# Patient Record
Sex: Female | Born: 1950
Health system: Southern US, Community
[De-identification: ages and names within clinical notes are randomized; demographics above are authoritative.]

## PROBLEM LIST (undated history)

## (undated) DIAGNOSIS — Z8614 Personal history of Methicillin resistant Staphylococcus aureus infection: Secondary | ICD-10-CM

## (undated) DIAGNOSIS — Z8542 Personal history of malignant neoplasm of other parts of uterus: Secondary | ICD-10-CM

## (undated) DIAGNOSIS — C801 Malignant (primary) neoplasm, unspecified: Secondary | ICD-10-CM

## (undated) DIAGNOSIS — R569 Unspecified convulsions: Secondary | ICD-10-CM

## (undated) DIAGNOSIS — M549 Dorsalgia, unspecified: Secondary | ICD-10-CM

## (undated) DIAGNOSIS — J449 Chronic obstructive pulmonary disease, unspecified: Secondary | ICD-10-CM

## (undated) DIAGNOSIS — G8929 Other chronic pain: Secondary | ICD-10-CM

## (undated) DIAGNOSIS — I10 Essential (primary) hypertension: Secondary | ICD-10-CM

## (undated) DIAGNOSIS — I2510 Atherosclerotic heart disease of native coronary artery without angina pectoris: Secondary | ICD-10-CM

## (undated) DIAGNOSIS — M542 Cervicalgia: Secondary | ICD-10-CM

## (undated) HISTORY — DX: Essential (primary) hypertension: I10

## (undated) HISTORY — PX: PARTIAL HYSTERECTOMY: SHX80

## (undated) HISTORY — PX: TUMOR EXCISION: SHX421

## (undated) HISTORY — DX: Personal history of Methicillin resistant Staphylococcus aureus infection: Z86.14

## (undated) HISTORY — DX: Dorsalgia, unspecified: M54.9

## (undated) HISTORY — PX: BACK SURGERY: SHX140

## (undated) HISTORY — PX: THYROID SURGERY: SHX805

## (undated) HISTORY — PX: THYMECTOMY: SHX1063

## (undated) HISTORY — DX: Personal history of malignant neoplasm of other parts of uterus: Z85.42

## (undated) HISTORY — DX: Cervicalgia: M54.2

## (undated) HISTORY — PX: NECK SURGERY: SHX720

## (undated) HISTORY — DX: Unspecified convulsions: R56.9

## (undated) HISTORY — DX: Chronic obstructive pulmonary disease, unspecified: J44.9

## (undated) HISTORY — DX: Atherosclerotic heart disease of native coronary artery without angina pectoris: I25.10

## (undated) HISTORY — DX: Other chronic pain: G89.29

---

## 1998-12-30 HISTORY — PX: APPENDECTOMY: SHX54

## 1999-05-15 ENCOUNTER — Ambulatory Visit (HOSPITAL_COMMUNITY): Admission: RE | Admit: 1999-05-15 | Discharge: 1999-05-15 | Payer: Self-pay | Admitting: Cardiology

## 1999-12-31 HISTORY — PX: CHOLECYSTECTOMY: SHX55

## 2001-03-18 ENCOUNTER — Observation Stay (HOSPITAL_COMMUNITY): Admission: RE | Admit: 2001-03-18 | Discharge: 2001-03-19 | Payer: Self-pay | Admitting: Neurosurgery

## 2001-04-17 ENCOUNTER — Encounter: Admission: RE | Admit: 2001-04-17 | Discharge: 2001-04-17 | Payer: Self-pay | Admitting: Neurosurgery

## 2001-05-14 ENCOUNTER — Encounter (HOSPITAL_COMMUNITY): Admission: RE | Admit: 2001-05-14 | Discharge: 2001-06-13 | Payer: Self-pay | Admitting: Neurosurgery

## 2001-06-20 ENCOUNTER — Ambulatory Visit (HOSPITAL_COMMUNITY): Admission: RE | Admit: 2001-06-20 | Discharge: 2001-06-20 | Payer: Self-pay | Admitting: Neurosurgery

## 2001-07-29 ENCOUNTER — Ambulatory Visit (HOSPITAL_BASED_OUTPATIENT_CLINIC_OR_DEPARTMENT_OTHER): Admission: RE | Admit: 2001-07-29 | Discharge: 2001-07-29 | Payer: Self-pay | Admitting: Orthopedic Surgery

## 2001-08-11 ENCOUNTER — Encounter (HOSPITAL_COMMUNITY): Admission: RE | Admit: 2001-08-11 | Discharge: 2001-09-10 | Payer: Self-pay | Admitting: Orthopedic Surgery

## 2001-09-11 ENCOUNTER — Encounter: Admission: RE | Admit: 2001-09-11 | Discharge: 2001-09-11 | Payer: Self-pay | Admitting: Neurosurgery

## 2001-09-15 ENCOUNTER — Encounter (HOSPITAL_COMMUNITY): Admission: RE | Admit: 2001-09-15 | Discharge: 2001-10-15 | Payer: Self-pay | Admitting: Orthopedic Surgery

## 2001-10-15 ENCOUNTER — Ambulatory Visit (HOSPITAL_COMMUNITY): Admission: RE | Admit: 2001-10-15 | Discharge: 2001-10-15 | Payer: Self-pay | Admitting: Family Medicine

## 2001-10-15 ENCOUNTER — Encounter: Payer: Self-pay | Admitting: Family Medicine

## 2002-03-17 ENCOUNTER — Ambulatory Visit (HOSPITAL_COMMUNITY): Admission: RE | Admit: 2002-03-17 | Discharge: 2002-03-17 | Payer: Self-pay | Admitting: Neurosurgery

## 2006-01-22 ENCOUNTER — Inpatient Hospital Stay (HOSPITAL_COMMUNITY): Admission: RE | Admit: 2006-01-22 | Discharge: 2006-01-23 | Payer: Self-pay | Admitting: Neurosurgery

## 2006-02-13 ENCOUNTER — Ambulatory Visit (HOSPITAL_COMMUNITY): Admission: RE | Admit: 2006-02-13 | Discharge: 2006-02-13 | Payer: Self-pay | Admitting: Neurosurgery

## 2006-05-01 ENCOUNTER — Ambulatory Visit (HOSPITAL_COMMUNITY): Admission: RE | Admit: 2006-05-01 | Discharge: 2006-05-01 | Payer: Self-pay | Admitting: Pulmonary Disease

## 2006-10-13 ENCOUNTER — Encounter: Admission: RE | Admit: 2006-10-13 | Discharge: 2006-10-13 | Payer: Self-pay | Admitting: Neurosurgery

## 2007-04-16 ENCOUNTER — Ambulatory Visit (HOSPITAL_COMMUNITY): Admission: RE | Admit: 2007-04-16 | Discharge: 2007-04-17 | Payer: Self-pay | Admitting: Neurosurgery

## 2007-12-18 ENCOUNTER — Encounter: Admission: RE | Admit: 2007-12-18 | Discharge: 2007-12-18 | Payer: Self-pay | Admitting: Neurosurgery

## 2009-08-03 ENCOUNTER — Ambulatory Visit (HOSPITAL_COMMUNITY): Admission: RE | Admit: 2009-08-03 | Discharge: 2009-08-03 | Payer: Self-pay | Admitting: Neurology

## 2009-09-27 ENCOUNTER — Ambulatory Visit (HOSPITAL_COMMUNITY): Admission: RE | Admit: 2009-09-27 | Discharge: 2009-09-27 | Payer: Self-pay | Admitting: Advanced Practice Midwife

## 2009-10-03 ENCOUNTER — Ambulatory Visit (HOSPITAL_COMMUNITY): Admission: RE | Admit: 2009-10-03 | Discharge: 2009-10-03 | Payer: Self-pay | Admitting: Family Medicine

## 2009-10-05 ENCOUNTER — Encounter (INDEPENDENT_AMBULATORY_CARE_PROVIDER_SITE_OTHER): Payer: Self-pay | Admitting: *Deleted

## 2009-10-05 ENCOUNTER — Ambulatory Visit: Payer: Self-pay | Admitting: Cardiothoracic Surgery

## 2009-10-06 ENCOUNTER — Ambulatory Visit: Payer: Self-pay | Admitting: Internal Medicine

## 2009-10-06 DIAGNOSIS — E079 Disorder of thyroid, unspecified: Secondary | ICD-10-CM | POA: Insufficient documentation

## 2009-10-06 DIAGNOSIS — R945 Abnormal results of liver function studies: Secondary | ICD-10-CM | POA: Insufficient documentation

## 2009-10-06 DIAGNOSIS — R634 Abnormal weight loss: Secondary | ICD-10-CM

## 2009-10-06 DIAGNOSIS — K838 Other specified diseases of biliary tract: Secondary | ICD-10-CM

## 2009-10-06 DIAGNOSIS — K5909 Other constipation: Secondary | ICD-10-CM

## 2009-10-07 ENCOUNTER — Encounter: Payer: Self-pay | Admitting: Gastroenterology

## 2009-10-11 LAB — CONVERTED CEMR LAB
ALT: 286 units/L — ABNORMAL HIGH (ref 0–35)
Alkaline Phosphatase: 258 units/L — ABNORMAL HIGH (ref 39–117)
Bilirubin, Direct: 0.2 mg/dL (ref 0.0–0.3)
INR: 0.9 (ref 0.0–1.5)
Indirect Bilirubin: 0.5 mg/dL (ref 0.0–0.9)
Total Protein: 6.9 g/dL (ref 6.0–8.3)

## 2009-10-12 ENCOUNTER — Encounter: Payer: Self-pay | Admitting: Internal Medicine

## 2009-10-12 ENCOUNTER — Ambulatory Visit: Payer: Self-pay | Admitting: Cardiothoracic Surgery

## 2009-10-13 ENCOUNTER — Ambulatory Visit (HOSPITAL_COMMUNITY): Admission: RE | Admit: 2009-10-13 | Discharge: 2009-10-13 | Payer: Self-pay | Admitting: Internal Medicine

## 2009-10-16 ENCOUNTER — Encounter: Payer: Self-pay | Admitting: Internal Medicine

## 2009-10-17 ENCOUNTER — Telehealth: Payer: Self-pay | Admitting: Gastroenterology

## 2009-10-17 ENCOUNTER — Ambulatory Visit (HOSPITAL_COMMUNITY): Admission: RE | Admit: 2009-10-17 | Discharge: 2009-10-17 | Payer: Self-pay | Admitting: Cardiothoracic Surgery

## 2009-10-18 ENCOUNTER — Encounter (INDEPENDENT_AMBULATORY_CARE_PROVIDER_SITE_OTHER): Payer: Self-pay | Admitting: *Deleted

## 2009-10-18 ENCOUNTER — Telehealth (INDEPENDENT_AMBULATORY_CARE_PROVIDER_SITE_OTHER): Payer: Self-pay | Admitting: *Deleted

## 2009-10-18 ENCOUNTER — Telehealth (INDEPENDENT_AMBULATORY_CARE_PROVIDER_SITE_OTHER): Payer: Self-pay

## 2009-10-19 ENCOUNTER — Ambulatory Visit: Payer: Self-pay | Admitting: Cardiothoracic Surgery

## 2009-10-19 ENCOUNTER — Encounter: Payer: Self-pay | Admitting: Internal Medicine

## 2009-10-31 ENCOUNTER — Ambulatory Visit: Payer: Self-pay | Admitting: Cardiology

## 2009-10-31 ENCOUNTER — Encounter: Payer: Self-pay | Admitting: Internal Medicine

## 2009-10-31 ENCOUNTER — Encounter (INDEPENDENT_AMBULATORY_CARE_PROVIDER_SITE_OTHER): Payer: Self-pay | Admitting: *Deleted

## 2009-10-31 DIAGNOSIS — I10 Essential (primary) hypertension: Secondary | ICD-10-CM

## 2009-10-31 DIAGNOSIS — I2510 Atherosclerotic heart disease of native coronary artery without angina pectoris: Secondary | ICD-10-CM

## 2009-10-31 LAB — CONVERTED CEMR LAB
ALT: 222 units/L
AST: 158 units/L
Alkaline Phosphatase: 232 units/L
Calcium: 9.2 mg/dL
Chloride: 100 meq/L
Creatinine, Ser: 0.46 mg/dL
HCT: 38.8 %
MCV: 92.8 fL
Platelets: 213 10*3/uL
WBC: 9.6 10*3/uL

## 2009-11-01 ENCOUNTER — Telehealth (INDEPENDENT_AMBULATORY_CARE_PROVIDER_SITE_OTHER): Payer: Self-pay

## 2009-11-02 ENCOUNTER — Ambulatory Visit: Payer: Self-pay | Admitting: Gastroenterology

## 2009-11-02 ENCOUNTER — Ambulatory Visit (HOSPITAL_COMMUNITY): Admission: RE | Admit: 2009-11-02 | Discharge: 2009-11-02 | Payer: Self-pay | Admitting: Gastroenterology

## 2009-11-03 ENCOUNTER — Encounter: Payer: Self-pay | Admitting: Internal Medicine

## 2009-11-03 ENCOUNTER — Ambulatory Visit: Payer: Self-pay | Admitting: Cardiology

## 2009-11-03 ENCOUNTER — Encounter (HOSPITAL_COMMUNITY): Admission: RE | Admit: 2009-11-03 | Discharge: 2009-12-03 | Payer: Self-pay | Admitting: Cardiology

## 2009-11-03 ENCOUNTER — Encounter: Payer: Self-pay | Admitting: Cardiology

## 2009-11-06 ENCOUNTER — Encounter: Payer: Self-pay | Admitting: Cardiothoracic Surgery

## 2009-11-06 ENCOUNTER — Inpatient Hospital Stay (HOSPITAL_COMMUNITY): Admission: RE | Admit: 2009-11-06 | Discharge: 2009-11-09 | Payer: Self-pay | Admitting: Cardiothoracic Surgery

## 2009-11-06 ENCOUNTER — Ambulatory Visit: Payer: Self-pay | Admitting: Cardiothoracic Surgery

## 2009-11-06 ENCOUNTER — Encounter: Payer: Self-pay | Admitting: Cardiology

## 2009-12-04 ENCOUNTER — Encounter: Admission: RE | Admit: 2009-12-04 | Discharge: 2009-12-04 | Payer: Self-pay | Admitting: Cardiothoracic Surgery

## 2009-12-04 ENCOUNTER — Encounter: Payer: Self-pay | Admitting: Internal Medicine

## 2009-12-04 ENCOUNTER — Ambulatory Visit: Payer: Self-pay | Admitting: Cardiothoracic Surgery

## 2009-12-15 ENCOUNTER — Encounter (INDEPENDENT_AMBULATORY_CARE_PROVIDER_SITE_OTHER): Payer: Self-pay

## 2010-01-01 ENCOUNTER — Encounter: Payer: Self-pay | Admitting: Internal Medicine

## 2010-01-04 LAB — CONVERTED CEMR LAB
ALT: 160 units/L — ABNORMAL HIGH (ref 0–35)
AST: 94 units/L — ABNORMAL HIGH (ref 0–37)
Albumin: 4 g/dL (ref 3.5–5.2)
Alkaline Phosphatase: 286 units/L — ABNORMAL HIGH (ref 39–117)
Indirect Bilirubin: 0.3 mg/dL (ref 0.0–0.9)
Total Protein: 6.4 g/dL (ref 6.0–8.3)

## 2010-01-08 ENCOUNTER — Encounter: Payer: Self-pay | Admitting: Internal Medicine

## 2010-01-16 ENCOUNTER — Encounter (HOSPITAL_COMMUNITY): Admission: RE | Admit: 2010-01-16 | Discharge: 2010-02-15 | Payer: Self-pay | Admitting: Endocrinology

## 2010-02-02 ENCOUNTER — Ambulatory Visit: Payer: Self-pay | Admitting: Internal Medicine

## 2010-02-03 IMAGING — CR DG CHEST 2V
2 series · 2 of 2 positions shown · non-contrast
Comparison: 11/08/2009 and CT chest 10/03/2009

CLINICAL DATA: Thymic hyperplasia.  Surgery 11/06/2009.

CHEST - 2 VIEW

[w chest pa]
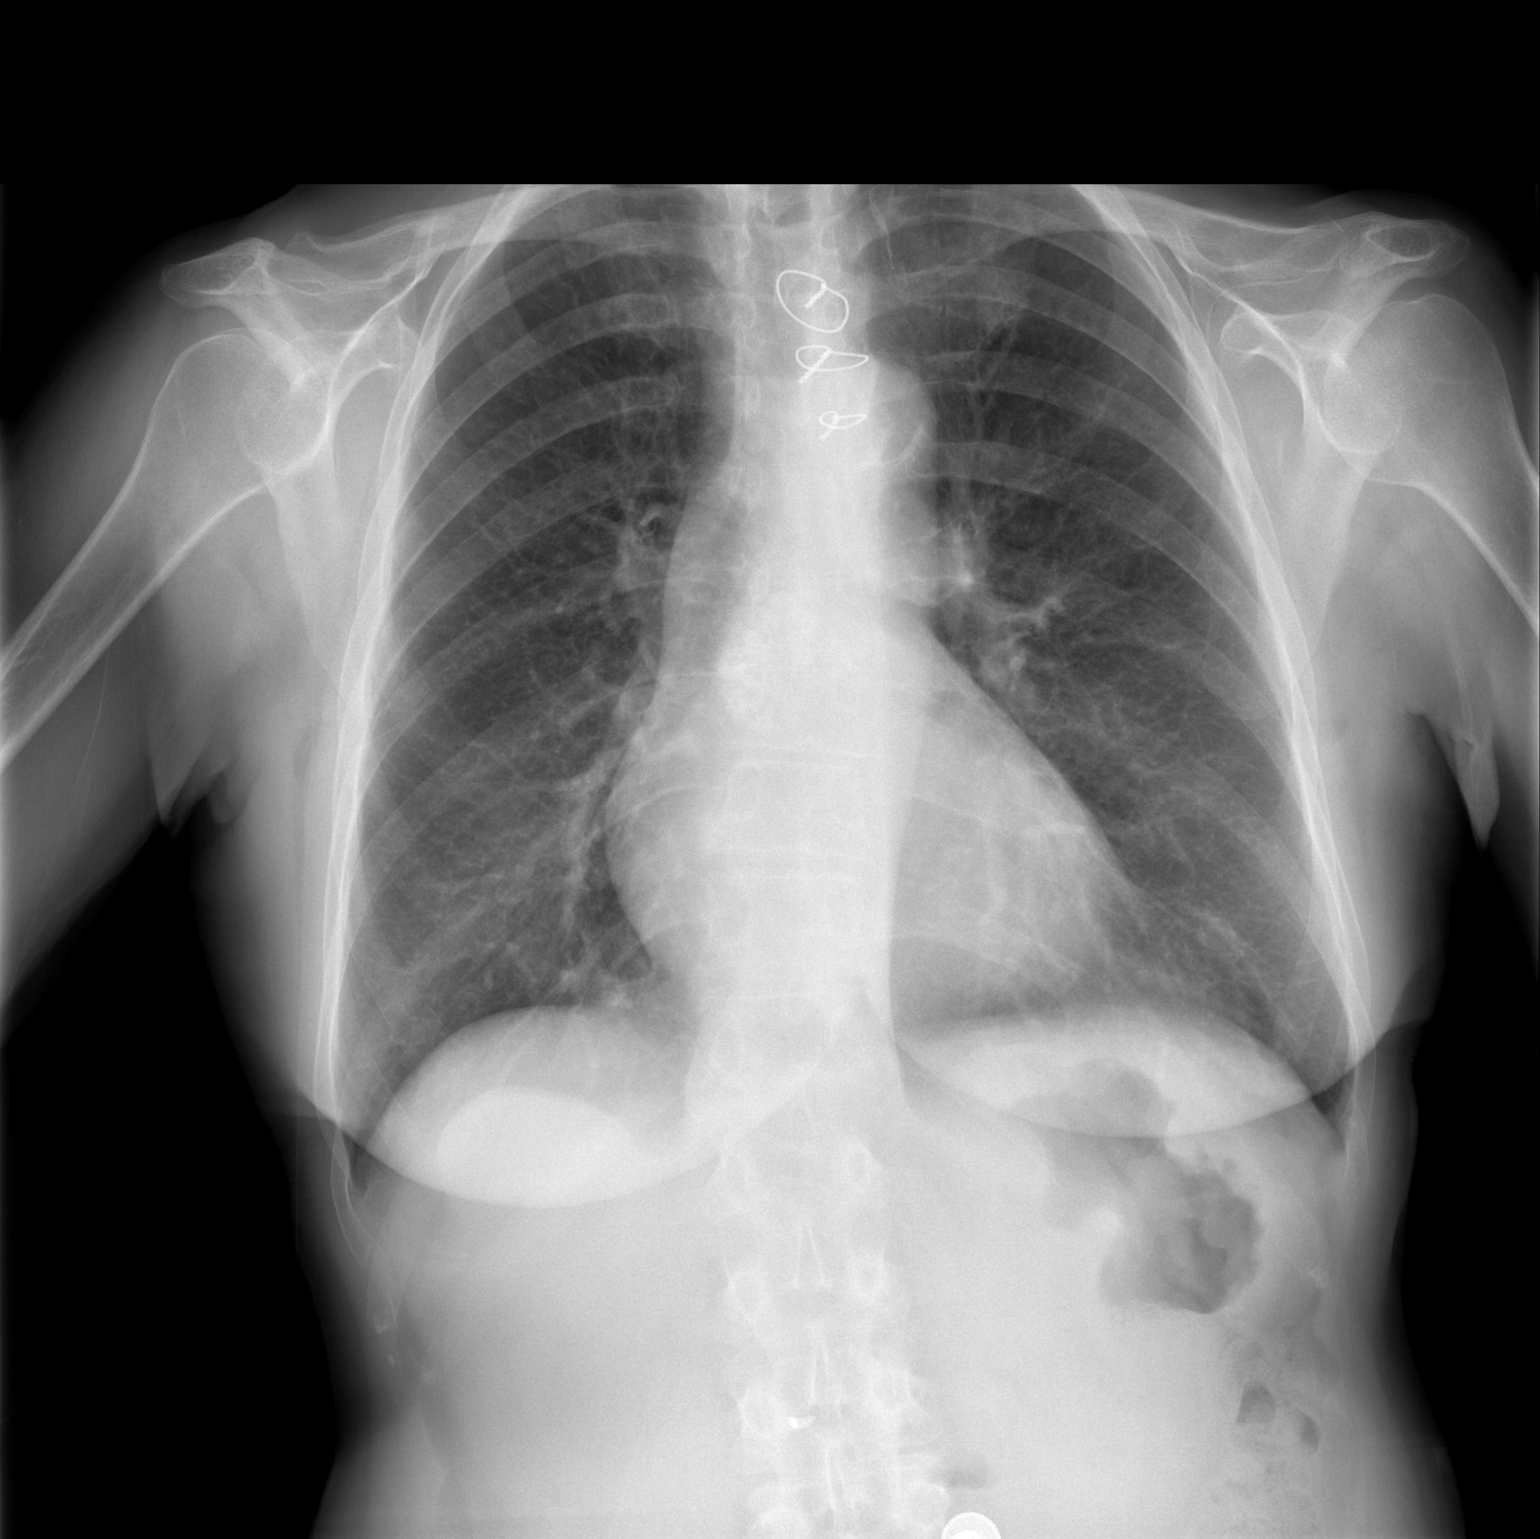

[w chest lat]
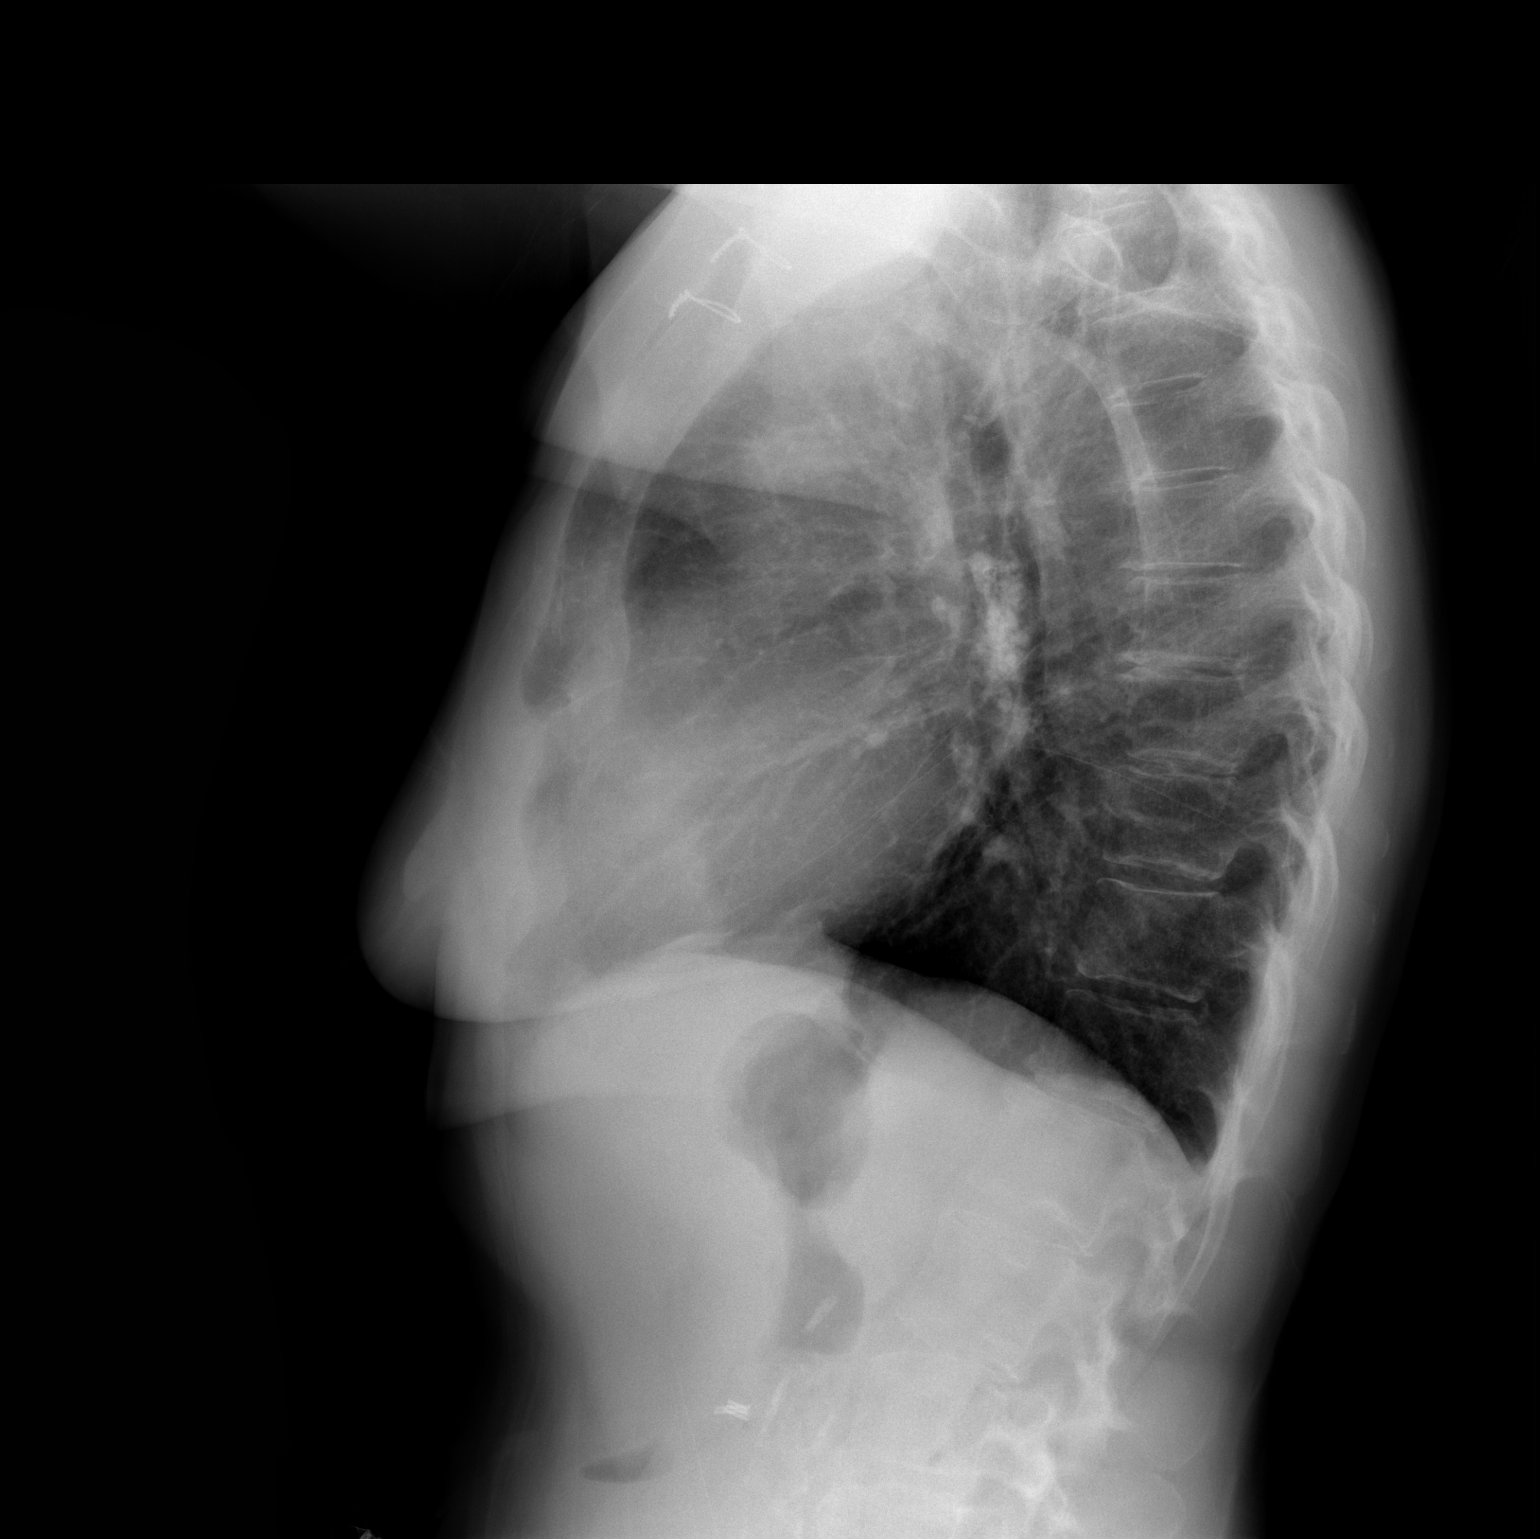

[2 of 2 positions shown; findings below may reference images not displayed]

FINDINGS: Trachea is midline.  Heart size normal.  There are
calcified subcarinal lymph nodes.  Three sternotomy wires are
present.  Right IJ central line has been removed.  Minimal biapical
scarring.  Emphysema.  Lungs are otherwise clear.  No pleural
fluid.
IMPRESSION: No acute findings after recent partial sternal splitting for
thymectomy.

## 2010-02-05 ENCOUNTER — Encounter: Payer: Self-pay | Admitting: Internal Medicine

## 2010-02-05 ENCOUNTER — Telehealth (INDEPENDENT_AMBULATORY_CARE_PROVIDER_SITE_OTHER): Payer: Self-pay

## 2010-02-06 ENCOUNTER — Encounter (INDEPENDENT_AMBULATORY_CARE_PROVIDER_SITE_OTHER): Payer: Self-pay

## 2010-02-19 ENCOUNTER — Encounter (INDEPENDENT_AMBULATORY_CARE_PROVIDER_SITE_OTHER): Payer: Self-pay

## 2010-02-26 ENCOUNTER — Encounter: Payer: Self-pay | Admitting: Urgent Care

## 2010-02-26 LAB — CONVERTED CEMR LAB
ALT: 294 units/L — ABNORMAL HIGH (ref 0–35)
AST: 169 units/L — ABNORMAL HIGH (ref 0–37)
Albumin: 4.5 g/dL (ref 3.5–5.2)
HCV Ab: NEGATIVE
Hepatitis B Surface Ag: NEGATIVE
Total Protein: 7.2 g/dL (ref 6.0–8.3)

## 2010-03-07 ENCOUNTER — Encounter (INDEPENDENT_AMBULATORY_CARE_PROVIDER_SITE_OTHER): Payer: Self-pay

## 2010-03-19 ENCOUNTER — Encounter (INDEPENDENT_AMBULATORY_CARE_PROVIDER_SITE_OTHER): Payer: Self-pay

## 2010-03-29 ENCOUNTER — Ambulatory Visit: Payer: Self-pay | Admitting: Cardiothoracic Surgery

## 2010-03-29 ENCOUNTER — Encounter: Admission: RE | Admit: 2010-03-29 | Discharge: 2010-03-29 | Payer: Self-pay | Admitting: Cardiothoracic Surgery

## 2010-04-11 ENCOUNTER — Encounter: Payer: Self-pay | Admitting: Urgent Care

## 2010-04-11 LAB — CONVERTED CEMR LAB
ALT: 44 units/L — ABNORMAL HIGH (ref 0–35)
Alkaline Phosphatase: 177 units/L — ABNORMAL HIGH (ref 39–117)
Bilirubin, Direct: 0.1 mg/dL (ref 0.0–0.3)
Indirect Bilirubin: 0.3 mg/dL (ref 0.0–0.9)
Total Protein: 6.7 g/dL (ref 6.0–8.3)

## 2010-04-19 ENCOUNTER — Encounter (INDEPENDENT_AMBULATORY_CARE_PROVIDER_SITE_OTHER): Payer: Self-pay

## 2010-04-30 ENCOUNTER — Emergency Department (HOSPITAL_COMMUNITY): Admission: EM | Admit: 2010-04-30 | Discharge: 2010-04-30 | Payer: Self-pay | Admitting: Emergency Medicine

## 2010-05-22 LAB — CONVERTED CEMR LAB
ALT: 50 units/L — ABNORMAL HIGH (ref 0–35)
AST: 65 units/L — ABNORMAL HIGH (ref 0–37)
Bilirubin, Direct: 0.1 mg/dL (ref 0.0–0.3)
Total Protein: 7.4 g/dL (ref 6.0–8.3)

## 2010-05-29 ENCOUNTER — Ambulatory Visit: Payer: Self-pay | Admitting: Internal Medicine

## 2010-05-29 DIAGNOSIS — E039 Hypothyroidism, unspecified: Secondary | ICD-10-CM | POA: Insufficient documentation

## 2010-05-31 DIAGNOSIS — Z862 Personal history of diseases of the blood and blood-forming organs and certain disorders involving the immune mechanism: Secondary | ICD-10-CM

## 2010-05-31 DIAGNOSIS — Z8639 Personal history of other endocrine, nutritional and metabolic disease: Secondary | ICD-10-CM

## 2010-06-27 ENCOUNTER — Emergency Department (HOSPITAL_COMMUNITY): Admission: EM | Admit: 2010-06-27 | Discharge: 2010-06-27 | Payer: Self-pay | Admitting: Emergency Medicine

## 2010-07-06 ENCOUNTER — Inpatient Hospital Stay (HOSPITAL_COMMUNITY): Admission: EM | Admit: 2010-07-06 | Discharge: 2010-07-10 | Payer: Self-pay | Admitting: Emergency Medicine

## 2010-08-30 ENCOUNTER — Encounter: Payer: Self-pay | Admitting: Internal Medicine

## 2010-08-30 ENCOUNTER — Encounter (INDEPENDENT_AMBULATORY_CARE_PROVIDER_SITE_OTHER): Payer: Self-pay

## 2010-10-17 ENCOUNTER — Encounter (INDEPENDENT_AMBULATORY_CARE_PROVIDER_SITE_OTHER): Payer: Self-pay | Admitting: *Deleted

## 2011-01-20 ENCOUNTER — Encounter: Payer: Self-pay | Admitting: Cardiothoracic Surgery

## 2011-01-29 NOTE — Assessment & Plan Note (Signed)
Summary: fu ov in 6 weeks/labs to be prior to appt/ss   Visit Type:  Follow-up Visit Primary Care Provider:  cresenzo  Chief Complaint:  follow up.  History of Present Illness: 60 year old lady with elevated liver functions. Status post I-131 ablation of thyroid and removal of thymic tumor. She  has fallen and she hurt her right leg as having cervical spine problems at this time. She is followed by Dr. Redmond Pulling in [place] for her thyroid. LFTs been nonspecifically elevated recently. Dilation of the biliary tree evaluated with MRCP EUS/EGD without significant pathology being found. Viral markers and autoimmune markers negative in this nice lady. Gallbladder long since removed.  Current Problems (verified): 1)  Essential Hypertension, Benign  (ICD-401.1) 2)  Coronary Atherosclerosis Native Coronary Artery  (ICD-414.01) 3)  Pre-operative Cardiovascular Examination  (ICD-V72.81) 4)  Thyroid Stimulating Hormone, Abnormal  (ICD-246.9) 5)  Constipation, Chronic  (ICD-564.09) 6)  Other Specified Disorders of Biliary Tract  (ICD-576.8) 7)  Weight Loss, Abnormal  (ICD-783.21) 8)  Nonspecific Abnormal Results Livr Function Study  (ICD-794.8)  Current Medications (verified): 1)  Trileptal 600 Mg Tabs (Oxcarbazepine) .... Two Times A Day 2)  Benicar 40 Mg Tabs (Olmesartan Medoxomil) .... Once Daily 3)  Amlodipine Besylate 5 Mg Tabs (Amlodipine Besylate) .... Once Daily 4)  Amitriptyline Hcl 50 Mg Tabs (Amitriptyline Hcl) .... At Bedtime 5)  Diazepam 5 Mg Tabs (Diazepam) .... Once Daily 6)  Albuterol Sulfate (2.5 Mg/36ml) 0.083% Nebu (Albuterol Sulfate) .... Prn 7)  Toprol Xl 25 Mg Xr24h-Tab (Metoprolol Succinate) .... Once Daily 8)  Norco 10-325 Mg Tabs (Hydrocodone-Acetaminophen) .... As Needed  Allergies: 1)  ! Pcn 2)  ! Morphine  Past History:  Family History: Last updated: 10/20/2009 Father: deceased- CHF, prostate problems, cirrhosis secondary to meds per pt Mother: deceased- lung  cancer Siblings: 2 brothers (one dying with lyphoma) ,5 sisters (2 with breast cancer, one at age 16, one in 56s) Daughter with breast cancer, age 3 No FH of Colon Cancer  Social History: Last updated: 10/31/2009 Marital Status: Married Children: 3 Occupation: disability Patient currently smokes. 1/2ppd Alcohol Use - no  Risk Factors: Smoking Status: current (October 20, 2009)  Past Medical History: Hypertension COPD Possible CAD s/p angioplasty 1998-1999 History of uterine cancer, age 82 Chronic neck and back pain tumor in chest hx of MRSA  Past Surgical History: Back surgery X 2 Neck surgery X 2 Cholecystectomy, 2001 Hysterectomy-partial, age 70, uterine cancer Appendectomy 2000 thyroid surgery tumor in chest  Vital Signs:  Patient profile:   60 year old female Height:      66 inches Weight:      143 pounds BMI:     23.16 Temp:     97.9 degrees F oral Pulse rate:   72 / minute BP sitting:   132 / 90  (left arm) Cuff size:   regular  Vitals Entered By: Burnadette Peter LPN (May 31, 624THL X33443 AM)  Physical Exam  General:  alert conversant no acute distress. She is wearing a cervical collar and soft cast splint right lower extremity Eyes:  no scleral icterus Lungs:  clear to auscultation Heart:  regular rate and rhythm without murmur gallop or Abdomen:  abdomen is soft and nontender without appreciable mass or hepatosplenomegaly  Impression & Recommendations: Impression: 60 year old lady with nonspecific mixed cholestatic and hepatocellular  enzyme pattern in the setting of hyperthyroidism.  Workup thus far is reassuring. I am hopeful enyme abnormalities will normalize once her thyroid state settles out. Recommendations: plan to  see this nicel lady back in 4 months with a set of liver function studies at that time  As a separate issue, sometime within the next 12 months will encourage her to go ahead and have her first ever screening colonoscopy.  Appended  Document: Orders Update    Clinical Lists Changes  Problems: Added new problem of LIVER FUNCTION TESTS, ABNORMAL, HX OF (ICD-V12.2) Added new problem of HYPOTHYROIDISM (ICD-244.9) Orders: Added new Service order of Est. Patient Level IV [id number]) - Signed      Appended Document: fu ov in 6 weeks/labs to be prior to appt/ss reminder in computer

## 2011-01-29 NOTE — Letter (Signed)
Summary: Recall, Labs Needed  [facility]  [address]   [place], [place] [address]   Phone: [phone]  Fax: [phone]    August 30, 2010  Erin Cooper [address] [place], [place]  [address] [birth year]   Dear Ms. Hollern,   Our records indicate it is time to repeat your blood work.  You can take the enclosed form to the lab on or near the date indicated.  Please make note of the new location of the lab:   [place], 2nd floor   [place] office will call you within a week to ten business days with the results.  If you do not hear from [place] in 10 business days, you should call the office.  If you have any questions regarding this, call the office at [phone], and ask for the nurse.  Labs are due on 09/24/2010.   Sincerely,    Burnadette Peter LPN  [facility] Ph: [phone]   Fax: [phone]

## 2011-01-29 NOTE — Letter (Signed)
Summary: Recall Office Visit  [facility]  [address]   [place], [place] [address]   Phone: [phone]  Fax: [phone]      October 17, 2010   ALAILA FLORIDA [address] [place], [place]  [address] 02-02-1951   Dear Ms. Ostlund,   According to our records, it is time for you to schedule a follow-up office visit with [place].   At your convenience, please call [phone] to schedule an office visit. If you have any questions, concerns, or feel that this letter is in error, we would appreciate your call.   Sincerely,    [facility] Ph: [phone]   Fax: [phone]

## 2011-01-29 NOTE — Miscellaneous (Signed)
Summary: Orders Update  Clinical Lists Changes  Orders: Added new Test order of T-Immunoglobulins, Quantitative [phone]) - Signed Added new Test order of T-ANA [phone]) - Signed Added new Test order of T-Anti SMA [id number]) - Signed

## 2011-01-29 NOTE — Miscellaneous (Signed)
Summary: Orders Update  Clinical Lists Changes  Orders: Added new Test order of T-Hepatic Function ([id number]) - Signed 

## 2011-01-29 NOTE — Letter (Signed)
Summary: office note-[facility]  office note-[facility]   Imported By: Stephan Minister 02/05/2010 16:29:50  _____________________________________________________________________  External Attachment:    Type:   Image     Comment:   External Document

## 2011-01-29 NOTE — Progress Notes (Signed)
----   Converted from flag ---- ---- 02/02/2010 1:36 PM, Andria Meuse FNP-BC wrote: I left message for this pt to call me back.  I talked w/ RMR.  Pt needs labs I ordered in 3-4 wks once thyroid has time to settle down.  Also need to know last TCS date?  Thx ------------------------------  Appended Document:  LMOM to call.  Appended Document:  LM for pt to call.  Appended Document:  Letter mailed to call.  Appended Document:  Spoke ith pt this AM. She has just completed the thyroid treatments, etc. She said she has never had a TCS.Will mail lab order to her.  Appended Document:  Lab order mailed to pt.

## 2011-01-29 NOTE — Letter (Signed)
Summary: Recall, Labs Needed  [facility]  [address]   [place], [place] [address]   Phone: [phone]  Fax: [phone]    February 19, 2010  TIKITA KRIS [address] [place], [place]  [address] [birth year]   Dear Ms. Teska,   Our records indicate it is time to repeat your blood work.  You can take the enclosed form to the lab on or near the date indicated.  Please make note of the new location of the lab:   [place], 2nd floor   [place] office will call you within a week to ten business days with the results.  If you do not hear from [place] in 10 business days, you should call the office.  If you have any questions regarding this, call the office at [phone], and ask for the nurse.  Labs are due on 03/21/2010.   Sincerely,    Burnadette Peter LPN  [facility] Ph: [phone]   Fax: [phone]

## 2011-01-29 NOTE — Letter (Signed)
Summary: [facility]  [facility]   Imported By: Zeb Comfort 01/01/2010 15:39:14  _____________________________________________________________________  External Attachment:    Type:   Image     Comment:   External Document  Appended Document: [facility] NOTE patient needs a hepatic profile and office visit with extender in February 2011  Appended Document: [facility]ICE NOTE pt has been mailed lab order

## 2011-01-29 NOTE — Letter (Signed)
Summary: [facility]   [facility]   Imported By: Sallee Provencal 01/02/2010 15:22:02  _____________________________________________________________________  External Attachment:    Type:   Image     Comment:   External Document

## 2011-01-29 NOTE — Letter (Signed)
Summary: Recall, Labs Needed  [facility]  [address]   [place], [place] [address]   Phone: [phone]  Fax: [phone]    April 19, 2010  Erin Cooper [address] [place], [place]  [address] 1951-12-01   Dear Ms. Covington,   Our records indicate it is time to repeat your blood work.  You can take the enclosed form to the lab on or near the date indicated.  Please make note of the new location of the lab:   [place], 2nd floor   [place] office will call you within a week to ten business days with the results.  If you do not hear from [place] in 10 business days, you should call the office.  If you have any questions regarding this, call the office at [phone], and ask for the nurse.  Labs are due on 05/21/10.   Sincerely,    Burnadette Peter LPN  [facility] Ph: [phone]   Fax: [phone]

## 2011-01-29 NOTE — Letter (Signed)
Summary: Plan of Care, Need to [facility]  [address]   [place], [place] [address]   Phone: [phone]  Fax: [phone]    February 06, 2010  AREEN ASARO [address] [place], [place]  [address] [birth year]   Dear Ms. Kolton,   We are writing this letter to inform you of treatment plans and/or discuss your plan of care.  We have tried several times to contact you; however, we have yet to reach you.  We ask that you please contact our office for follow-up on your gastrointestinal issues.  We can  be reached at [phone] to schedule an appointment, or to speak with someone regarding your health care needs.  Please do not neglect your health.   Sincerely,    Waldon Merl LPN  [facility] Ph: [phone]    Fax: [phone]

## 2011-01-29 NOTE — Assessment & Plan Note (Signed)
Summary: office visit with extender in February 2011/GU   Visit Type:  Follow-up Visit Primary Care Provider:  Dr. Adolph Pollack  Chief Complaint:  follow up visit- pt had recent surgery.  History of Present Illness: 60 y/o caucasian female w/ hx abnl LFTs.  Started radioactive iodine treatment for thyroiditis 4 days ago.  c/o fatigue.  Denies pruritis or jaundice.  Appetite ok.  c/o generalized pain, feels like "bad gas."  Pain 5/10, intermittant.  Benign thymic mass removed 10/2009.  Denies N/V.  Under stress w/ alcoholic sister & brother dying of lymphoma.  Sees Dr Chalmers Cater for thyroid.  Takes pain pills two times a day.  Denies any drug use.  No sexual promiscuity.  Does not think she has been checked for virl hepatitis.  Hx CBD 41mm (dilated) w/ MRCP & EUS recently.  LFTS remain 2-4x normal.  Liver normal on previous CT.  TSH  [L]  0.012 uIU/mL still abnl total bilirubin 1.3, now 0.4 alkaline phosphatase 232, now 286 AST 158, now 94 ALT 222, now 160  EUS->  Diffusely dilated CBD (up to 73mm) without obvious anatomic cause.No pancreatic, biliary masses, no CBD stones, no signs  of chronic pancreatitis.          Current Problems (verified): 1)  Essential Hypertension, Benign  (ICD-401.1) 2)  Coronary Atherosclerosis Native Coronary Artery  (ICD-414.01) 3)  Pre-operative Cardiovascular Examination  (ICD-V72.81) 4)  Thyroid Stimulating Hormone, Abnormal  (ICD-246.9) 5)  Constipation, Chronic  (ICD-564.09) 6)  Other Specified Disorders of Biliary Tract  (ICD-576.8) 7)  Weight Loss, Abnormal  (ICD-783.21) 8)  Nonspecific Abnormal Results Livr Function Study  (ICD-794.8)  Current Medications (verified): 1)  Trileptal 600 Mg Tabs (Oxcarbazepine) .... Two Times A Day 2)  Benicar 40 Mg Tabs (Olmesartan Medoxomil) .... Once Daily 3)  Amlodipine Besylate 5 Mg Tabs (Amlodipine Besylate) .... Once Daily 4)  Amitriptyline Hcl 50 Mg Tabs (Amitriptyline Hcl) .... At Bedtime 5)  Diazepam 5 Mg Tabs  (Diazepam) .... Once Daily 6)  Albuterol Sulfate (2.5 Mg/50ml) 0.083% Nebu (Albuterol Sulfate) .... Prn 7)  Hydrocodone-Acetaminophen 10-325 Mg Tabs (Hydrocodone-Acetaminophen) .... As Needed 8)  Toprol Xl 25 Mg Xr24h-Tab (Metoprolol Succinate) .... Once Daily  Allergies (verified): 1)  ! Pcn  Review of Systems General:  Complains of fatigue and weight loss; denies fever, chills, sweats, anorexia, weakness, malaise, and sleep disorder. CV:  Denies chest pains, angina, palpitations, syncope, dyspnea on exertion, orthopnea, PND, peripheral edema, and claudication. Resp:  Denies dyspnea at rest, dyspnea with exercise, cough, sputum, wheezing, coughing up blood, and pleurisy. GI:  Denies difficulty swallowing, pain on swallowing, nausea, indigestion/heartburn, vomiting, vomiting blood, jaundice, gas/bloating, diarrhea, constipation, change in bowel habits, bloody BM's, black BMs, and fecal incontinence. Derm:  Denies rash, itching, dry skin, hives, moles, warts, and unhealing ulcers. Psych:  Denies depression, anxiety, memory loss, suicidal ideation, hallucinations, paranoia, phobia, and confusion. Heme:  Denies bruising, bleeding, and enlarged lymph nodes.  Vital Signs:  Patient profile:   60 year old female Height:      66 inches Weight:      131 pounds BMI:     21.22 Temp:     98.2 degrees F oral Pulse rate:   88 / minute BP sitting:   120 / 88  (left arm) Cuff size:   regular  Vitals Entered By: Burnadette Peter LPN (February  4, 624THL 11:32 AM)  Physical Exam  General:  Well developed, well nourished, no acute distress. Head:  Normocephalic and  atraumatic. Eyes:  Sclera clear, no icterus. Mouth:  No deformity or lesions, dentition normal. Neck:  Supple; no masses or thyromegaly. Lungs:  Clear throughout to auscultation. Heart:  Regular rate and rhythm; no murmurs, rubs,  or bruits. Abdomen:  Soft, nontender and nondistended. No masses, hepatosplenomegaly or hernias noted. Normal  bowel sounds.without guarding and without rebound.   Msk:  Symmetrical with no gross deformities. Normal posture. Pulses:  Normal pulses noted. Extremities:  No clubbing, cyanosis, edema or deformities noted. Neurologic:  Alert and  oriented x4;  grossly normal neurologically. Skin:  Intact without significant lesions or rashes. Cervical Nodes:  No significant cervical adenopathy. Psych:  Alert and cooperative. Normal mood and affect.  Impression & Recommendations:  Problem # 1:  NONSPECIFIC ABNORMAL RESULTS LIVR FUNCTION STUDY (ICD-794.8) 60 y/o Caucasian female w/ hx elevated LFTs.  Very complex medical issues are complicating picture.  SHe just received RAI treatment for thyroiditis, recently had thymic mass removed, and has CBD dilation.  Occ bouts of self-limited generalized abd pain and fatigue.  I suspect transaminitis may be secordary to thyroid dysfunction.  She should have viral hepatitis ruled out although little in the way of risk factors.  Autoimmune hepatits ahould remain in differential.  CBD dilatation may be physiologic s/p cholecystectomy, less likely sphincter of Oddi dysfunction.  I have discussed this case w/ Dr Gala Romney and plans are to recheck LFTS and hepatitis markers in 3-4 weeks.  Further work-up pending thyroid treatment.  Orders: Est. Patient Level IV (99214)Future Orders: T-Hepatic Function [id number]) ... 02/23/2010 T-Hepatitis C Antibody [id number]) ... 02/23/2010 T-Hepatitis B Surface Antigen [phone]) ... 02/23/2010  Problem # 2:  OTHER SPECIFIED DISORDERS OF BILIARY TRACT (ICD-576.8) See #1  Orders: Est. Patient Level IV [id number])  Appended Document: office visit with extender in February 2011/GU Pt also needs colonoscopy as she has never had one.  Will arrange at next OV.

## 2011-01-29 NOTE — Letter (Signed)
Summary: Recall, Labs Needed  [facility]  [address]   [place], [place] [address]   Phone: [phone]  Fax: [phone]    March 19, 2010  Erin Cooper [address] [place], [place]  [address] [birth year]   Dear Erin Cooper,   Our records indicate it is time to repeat your blood work.  You can take the enclosed form to the lab on or near the date indicated.  Please make note of the new location of the lab:   [place], 2nd floor   [place] office will call you within a week to ten business days with the results.  If you do not hear from [place] in 10 business days, you should call the office.  If you have any questions regarding this, call the office at [phone], and ask for the nurse.  Labs are due on 04/06/2010.   Sincerely,    Burnadette Peter LPN  [facility] Ph: [phone]   Fax: [phone]

## 2011-03-17 LAB — DIFFERENTIAL
Basophils Absolute: 0 10*3/uL (ref 0.0–0.1)
Basophils Relative: 0 % (ref 0–1)
Basophils Relative: 0 % (ref 0–1)
Eosinophils Absolute: 0.1 10*3/uL (ref 0.0–0.7)
Eosinophils Absolute: 0.1 10*3/uL (ref 0.0–0.7)
Monocytes Absolute: 0.4 10*3/uL (ref 0.1–1.0)
Monocytes Relative: 5 % (ref 3–12)
Monocytes Relative: 5 % (ref 3–12)
Neutro Abs: 3.3 10*3/uL (ref 1.7–7.7)
Neutro Abs: 3.3 10*3/uL (ref 1.7–7.7)
Neutrophils Relative %: 40 % — ABNORMAL LOW (ref 43–77)

## 2011-03-17 LAB — POCT I-STAT, CHEM 8
BUN: 4 mg/dL — ABNORMAL LOW (ref 6–23)
BUN: <3 mg/dL — ABNORMAL LOW (ref 6–23)
Calcium, Ion: 0.96 mmol/L — ABNORMAL LOW (ref 1.12–1.32)
Chloride: 94 mEq/L — ABNORMAL LOW (ref 96–112)
HCT: 39 % (ref 36.0–46.0)
HCT: 37 % (ref 36.0–46.0)
Hemoglobin: 12.6 g/dL (ref 12.0–15.0)
Potassium: 3.5 mEq/L (ref 3.5–5.1)
Sodium: 125 mEq/L — ABNORMAL LOW (ref 135–145)
Sodium: 129 mEq/L — ABNORMAL LOW (ref 135–145)
TCO2: 23 mmol/L (ref 0–100)

## 2011-03-17 LAB — POCT CARDIAC MARKERS
CKMB, poc: 12.5 ng/mL (ref 1.0–8.0)
CKMB, poc: 13.4 ng/mL (ref 1.0–8.0)
Myoglobin, poc: 184 ng/mL (ref 12–200)
Myoglobin, poc: 232 ng/mL (ref 12–200)
Troponin i, poc: <0.05 ng/mL (ref 0.00–0.09)

## 2011-03-17 LAB — LIPID PANEL
LDL Cholesterol: 177 mg/dL — ABNORMAL HIGH (ref 0–99)
Total CHOL/HDL Ratio: 3.2 RATIO
Triglycerides: 122 mg/dL (ref <150)
VLDL: 24 mg/dL (ref 0–40)

## 2011-03-17 LAB — CBC
HCT: 36.8 % (ref 36.0–46.0)
Hemoglobin: 13.2 g/dL (ref 12.0–15.0)
Hemoglobin: 12.8 g/dL (ref 12.0–15.0)
MCH: 36.7 pg — ABNORMAL HIGH (ref 26.0–34.0)
MCH: 35.2 pg — ABNORMAL HIGH (ref 26.0–34.0)
MCHC: 35.7 g/dL (ref 30.0–36.0)
MCHC: 34.8 g/dL (ref 30.0–36.0)
MCV: 102.5 fL — ABNORMAL HIGH (ref 78.0–100.0)
Platelets: 200 10*3/uL (ref 150–400)
Platelets: 208 10*3/uL (ref 150–400)
RBC: 3.61 MIL/uL — ABNORMAL LOW (ref 3.87–5.11)
RDW: 15.3 % (ref 11.5–15.5)
WBC: 6.3 10*3/uL (ref 4.0–10.5)

## 2011-03-17 LAB — BASIC METABOLIC PANEL
BUN: 5 mg/dL — ABNORMAL LOW (ref 6–23)
BUN: 6 mg/dL (ref 6–23)
CO2: 24 mEq/L (ref 19–32)
CO2: 26 mEq/L (ref 19–32)
CO2: 26 mEq/L (ref 19–32)
CO2: 23 mEq/L (ref 19–32)
Calcium: 8.6 mg/dL (ref 8.4–10.5)
Chloride: 103 mEq/L (ref 96–112)
Chloride: 96 mEq/L (ref 96–112)
Creatinine, Ser: 0.87 mg/dL (ref 0.4–1.2)
Creatinine, Ser: 1.02 mg/dL (ref 0.4–1.2)
Creatinine, Ser: 1.05 mg/dL (ref 0.4–1.2)
GFR calc Af Amer: >60 mL/min (ref >60)
GFR calc non Af Amer: 53 mL/min — ABNORMAL LOW (ref >60)
Glucose, Bld: 95 mg/dL (ref 70–99)
Glucose, Bld: 105 mg/dL — ABNORMAL HIGH (ref 70–99)
Potassium: 3.4 mEq/L — ABNORMAL LOW (ref 3.5–5.1)
Sodium: 123 mEq/L — ABNORMAL LOW (ref 135–145)
Sodium: 124 mEq/L — ABNORMAL LOW (ref 135–145)

## 2011-03-17 LAB — COMPREHENSIVE METABOLIC PANEL
AST: 61 U/L — ABNORMAL HIGH (ref 0–37)
Albumin: 3.9 g/dL (ref 3.5–5.2)
Alkaline Phosphatase: 79 U/L (ref 39–117)
BUN: 5 mg/dL — ABNORMAL LOW (ref 6–23)
GFR calc Af Amer: >60 mL/min (ref >60)
Potassium: 4 mEq/L (ref 3.5–5.1)
Total Protein: 6.4 g/dL (ref 6.0–8.3)

## 2011-03-17 LAB — MRSA PCR SCREENING: MRSA by PCR: NEGATIVE

## 2011-03-17 LAB — OSMOLALITY: Osmolality: 248 mOsm/kg — ABNORMAL LOW (ref 275–300)

## 2011-03-17 LAB — TSH: TSH: 84.898 u[IU]/mL — ABNORMAL HIGH (ref 0.350–4.500)

## 2011-03-17 LAB — CK TOTAL AND CKMB (NOT AT ARMC): Total CK: 2376 U/L — ABNORMAL HIGH (ref 7–177)

## 2011-03-17 LAB — TROPONIN I: Troponin I: 0.02 ng/mL (ref 0.00–0.06)

## 2011-03-17 LAB — SODIUM, URINE, RANDOM: Sodium, Ur: 53 mEq/L

## 2011-04-03 LAB — CBC
HCT: 30.9 % — ABNORMAL LOW (ref 36.0–46.0)
HCT: 31.7 % — ABNORMAL LOW (ref 36.0–46.0)
HCT: 36.7 % (ref 36.0–46.0)
Hemoglobin: 11 g/dL — ABNORMAL LOW (ref 12.0–15.0)
Hemoglobin: 11.2 g/dL — ABNORMAL LOW (ref 12.0–15.0)
Hemoglobin: 13 g/dL (ref 12.0–15.0)
MCHC: 35.3 g/dL (ref 30.0–36.0)
MCHC: 35.4 g/dL (ref 30.0–36.0)
MCHC: 35.5 g/dL (ref 30.0–36.0)
MCV: 95 fL (ref 78.0–100.0)
MCV: 96 fL (ref 78.0–100.0)
MCV: 96.1 fL (ref 78.0–100.0)
Platelets: 147 10*3/uL — ABNORMAL LOW (ref 150–400)
Platelets: 150 10*3/uL (ref 150–400)
Platelets: 209 10*3/uL (ref 150–400)
RBC: 3.25 MIL/uL — ABNORMAL LOW (ref 3.87–5.11)
RBC: 3.31 MIL/uL — ABNORMAL LOW (ref 3.87–5.11)
RBC: 3.82 MIL/uL — ABNORMAL LOW (ref 3.87–5.11)
RDW: 12.5 % (ref 11.5–15.5)
RDW: 12.9 % (ref 11.5–15.5)
RDW: 13.3 % (ref 11.5–15.5)
WBC: 10.5 10*3/uL (ref 4.0–10.5)
WBC: 6 10*3/uL (ref 4.0–10.5)
WBC: 6.3 10*3/uL (ref 4.0–10.5)

## 2011-04-03 LAB — BASIC METABOLIC PANEL
BUN: 2 mg/dL — ABNORMAL LOW (ref 6–23)
CO2: 30 mEq/L (ref 19–32)
Calcium: 8.7 mg/dL (ref 8.4–10.5)
Chloride: 100 mEq/L (ref 96–112)
Creatinine, Ser: 0.42 mg/dL (ref 0.4–1.2)
GFR calc Af Amer: >60 mL/min (ref >60)
GFR calc non Af Amer: >60 mL/min (ref >60)
Glucose, Bld: 130 mg/dL — ABNORMAL HIGH (ref 70–99)
Potassium: 3.5 mEq/L (ref 3.5–5.1)
Sodium: 135 mEq/L (ref 135–145)

## 2011-04-03 LAB — COMPREHENSIVE METABOLIC PANEL
ALT: 109 U/L — ABNORMAL HIGH (ref 0–35)
ALT: 144 U/L — ABNORMAL HIGH (ref 0–35)
AST: 37 U/L (ref 0–37)
AST: 69 U/L — ABNORMAL HIGH (ref 0–37)
Albumin: 3.4 g/dL — ABNORMAL LOW (ref 3.5–5.2)
Albumin: 3.8 g/dL (ref 3.5–5.2)
Alkaline Phosphatase: 186 U/L — ABNORMAL HIGH (ref 39–117)
Alkaline Phosphatase: 216 U/L — ABNORMAL HIGH (ref 39–117)
BUN: 3 mg/dL — ABNORMAL LOW (ref 6–23)
BUN: 9 mg/dL (ref 6–23)
CO2: 22 mEq/L (ref 19–32)
CO2: 29 mEq/L (ref 19–32)
Calcium: 9 mg/dL (ref 8.4–10.5)
Calcium: 9.1 mg/dL (ref 8.4–10.5)
Chloride: 102 mEq/L (ref 96–112)
Chloride: 105 mEq/L (ref 96–112)
Creatinine, Ser: 0.44 mg/dL (ref 0.4–1.2)
Creatinine, Ser: 0.59 mg/dL (ref 0.4–1.2)
GFR calc Af Amer: >60 mL/min (ref >60)
GFR calc Af Amer: >60 mL/min (ref >60)
GFR calc non Af Amer: >60 mL/min (ref >60)
GFR calc non Af Amer: >60 mL/min (ref >60)
Glucose, Bld: 109 mg/dL — ABNORMAL HIGH (ref 70–99)
Glucose, Bld: 116 mg/dL — ABNORMAL HIGH (ref 70–99)
Potassium: 3.5 mEq/L (ref 3.5–5.1)
Potassium: 4.2 mEq/L (ref 3.5–5.1)
Sodium: 136 mEq/L (ref 135–145)
Sodium: 138 mEq/L (ref 135–145)
Total Bilirubin: 0.4 mg/dL (ref 0.3–1.2)
Total Bilirubin: 0.8 mg/dL (ref 0.3–1.2)
Total Protein: 6.1 g/dL (ref 6.0–8.3)
Total Protein: 6.1 g/dL (ref 6.0–8.3)

## 2011-04-03 LAB — URINALYSIS, ROUTINE W REFLEX MICROSCOPIC
Bilirubin Urine: NEGATIVE
Glucose, UA: NEGATIVE mg/dL
Hgb urine dipstick: NEGATIVE
Ketones, ur: NEGATIVE mg/dL
Nitrite: NEGATIVE
Protein, ur: NEGATIVE mg/dL
Specific Gravity, Urine: 1.013 (ref 1.005–1.030)
Urobilinogen, UA: 0.2 mg/dL (ref 0.0–1.0)
pH: 5.5 (ref 5.0–8.0)

## 2011-04-03 LAB — BLOOD GAS, ARTERIAL
Acid-Base Excess: 1.7 mmol/L (ref 0.0–2.0)
Bicarbonate: 25.4 mEq/L — ABNORMAL HIGH (ref 20.0–24.0)
Drawn by: 313941
FIO2: 0.21 %
O2 Saturation: 97.5 %
Patient temperature: 98.6
TCO2: 26.6 mmol/L (ref 0–100)
pCO2 arterial: 38 mmHg (ref 35.0–45.0)
pH, Arterial: 7.44 — ABNORMAL HIGH (ref 7.350–7.400)
pO2, Arterial: 91.4 mmHg (ref 80.0–100.0)

## 2011-04-03 LAB — POCT I-STAT 3, ART BLOOD GAS (G3+)
Acid-Base Excess: 5 mmol/L — ABNORMAL HIGH (ref 0.0–2.0)
Bicarbonate: 30.7 mEq/L — ABNORMAL HIGH (ref 20.0–24.0)
O2 Saturation: 93 %
Patient temperature: 99
TCO2: 32 mmol/L (ref 0–100)
pCO2 arterial: 51.6 mmHg — ABNORMAL HIGH (ref 35.0–45.0)
pH, Arterial: 7.383 (ref 7.350–7.400)
pO2, Arterial: 72 mmHg — ABNORMAL LOW (ref 80.0–100.0)

## 2011-04-03 LAB — PROTIME-INR
INR: 0.87 (ref 0.00–1.49)
Prothrombin Time: 11.8 seconds (ref 11.6–15.2)

## 2011-04-03 LAB — TYPE AND SCREEN
ABO/RH(D): O POS
Antibody Screen: NEGATIVE

## 2011-04-03 LAB — APTT: aPTT: 26 seconds (ref 24–37)

## 2011-04-03 LAB — ABO/RH: ABO/RH(D): O POS

## 2011-04-04 LAB — GLUCOSE, CAPILLARY: Glucose-Capillary: 102 mg/dL — ABNORMAL HIGH (ref 70–99)

## 2011-05-14 NOTE — Assessment & Plan Note (Signed)
OFFICE VISIT   SHALLA, GREEK  DOB:  05-20-1951                                        March 29, 2010  CHART #:  [id number]   HISTORY:  The patient returns to the office with a followup chest x-ray  and for evaluation after removal of her hyperplastic thymus.  She notes  since surgery, she has also been treated for her hyperthyroid state and  overall feels much better.  Note, she has regular followups with Dr.  Chalmers Cater who has been handling her thyroid treatment.  She notes that her  sternal incision does not bother much.   PHYSICAL EXAMINATION:  Her blood pressure 105/67, pulse is 43,  respiratory rate is 18, and O2 sats 96%.  Her partial sternal split  incision is well healed.  Her lungs are clear.  Cardiac exam reveals  regular rate and rhythm without murmur or gallop.   DIAGNOSTIC TESTS:  Followup chest x-ray shows sternal wires in good  position and no other abnormality.   IMPRESSION:  Overall, she has made good progress after her thymectomy  for question of a thymic tumor and I have not made her return  appointment to see me, but I would be glad to see her at Dr. Porfirio Oar  request or the patient.   Lanelle Bal, MD  Electronically Signed   EG/MEDQ  D:  03/29/2010  T:  03/30/2010  Job:  [id number]   cc:   Bonne Dolores, M.D.  Jacelyn Pi, MD

## 2011-05-14 NOTE — Assessment & Plan Note (Signed)
OFFICE VISIT   Erin Cooper, Erin Cooper  DOB:  03-Mar-1951                                        October 19, 2009  CHART #:  [id number]   The patient comes to the office today after further evaluation of the  question of thymic tumor.  She had originally presented with an abnormal  CT scan.  Subsequently, a PET scan was done this week to further  evaluate the mediastinal mass.  Concurrently, the patient has been  evaluated by GI in [place] with weight loss and abnormal liver  testing and dilatation of the biliary duct.  CT scan shows no pancreatic  mass and PET scan shows no areas of increased uptake in the abdomen.   On exam, the patient's blood pressure is 164/92, pulse is 85,  respiratory rate is 18, O2 sats 97%.  Her lungs are clear bilaterally.  Abdominal exam is unchanged without palpable masses or tenderness.   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  With this finding, I have recommended to the patient that we  should proceed with at least partial sternotomy or possibly complete  sternotomy and thymectomy.  The patient continues to smoke, and I have  again strongly encouraged her to stop prior to surgery.  Though not  having active cardiac symptoms at this point, she does give the vague  history of seeing Dr. Lovena Le and being referred to one of his partners  for an angioplasty more than 10 years ago.  She notes no recent  cardiology followup.  Although the history is vague, I do not have the  details.  We will make arrangement for her to be reevaluated by [facility] for cardiac preoperative clearance.  She will keep her  appointment for upper GI endoscopy to evaluate the pancreatic and  biliary duct  on November 02, 2009.  We will plan thymectomy on November 06, 2009.  Dictating node on normal.   Lanelle Bal, MD  Electronically Signed   EG/MEDQ  D:  10/19/2009  T:  10/19/2009  Job:  [id number]   cc:    Bonne Dolores, M.D.  Bridgette Habermann, M.D.  Champ Mungo. Lovena Le, MD

## 2011-05-14 NOTE — Assessment & Plan Note (Signed)
OFFICE VISIT   ANTWAN, RUCCI  DOB:  1951/10/31                                        December 04, 2009  CHART #:  [id number]   REASON FOR OFFICE VISIT:  Status post partial median sternotomy for  thymectomy.   HISTORY OF PRESENTING ILLNESS:  This is a 60 year old female who had  originally presented with vague symptoms of fatigue, abdominal pain, and  weight loss.  She was evaluated with a CT scan of the chest and abdomen.  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  There was no evidence of any intra-  abdominal malignancy.  A PET scan was also done which showed an anterior  mediastinal soft tissue mass with a maximum SUV of 5.3 (worrisome for  malignancy), coronary artery disease, but no evidence of hypermetabolic  metastatic disease.  The patient was seen in consultation by Dr.  Servando Snare.  Because the patient had a history of a myocardial infarction  as well as having other cardiac risk factors, a cardiology workup was  done by Dr. Lovena Le.  Once cardiac clearance was given, the patient was  admitted to [facility] on November 06, 2009, to undergo thymectomy.  Pathology results show that there was no tumor in the lymph node and  that the thymic tissue was likely hyperplastic.  The patient states that  since having been discharged from the hospital, she is doing well  overall.  Her only problem postoperatively was the fact that she began  having hallucinations after taking the oxycodone/APAP she was given for  postoperative pain.  This was discontinued and she states she took  hydrocodone/APAP (which she had taken preoperatively which relieved her  pain and did not give her any hallucinations).  Currently, the patient  denies any chest pain or shortness of breath.   PHYSICAL EXAMINATION:  General:  This is a pleasant  60 year old  Caucasian female who is in no acute distress, who is alert, oriented,  and cooperative.  Vital Signs:  BP is 149/84, pulse rate 84,  respirations 18, O2 sat 97%.  Cardiovascular:  Regular rate and rhythm.  S1 and S2.  No murmurs, gallops, or rubs.  Pulmonary:  Clear to  auscultation bilaterally.  No rales, wheezes, or rhonchi.  Abdomen:  Soft, nontender.  Bowel sounds present.  Sternal wound is clean, dry,  well healed.  Extremities:  No cyanosis, clubbing, or edema.   DIAGNOSTIC TESTS:  Chest x-ray done today showed calcified subcarinal  lymph nodes, minimal biapical scarring emphysema.  Lungs otherwise  clear.   IMPRESSION AND PLAN:  The patient is surgically stable status post  thymectomy.  Because her blood pressure is elevated on today's exam, her  amlodipine which she had been taking preoperatively at 2.5 mg p.o. daily  was resumed.  She was also instructed to continue her other medicines of  Benicar and Toprol-XL as she has been taking postoperatively.  The  patient was instructed she may begin driving short distances provided  she does not have any pain and is not on any narcotics.  She gradually  over the next couple of weeks may increase her driving distance.  The  patient was encouraged about smoking cessation.  She does state she is  smoking but less so.  I talked with her about using a nicotine patch.  She said that she will think about this.  She does not want to use  Chantix.  The patient does have a followup appointment to see her  medical doctor in January.  We will likely see her on a p.r.n. basis,  and she will continue to be followed by her medical doctor.   Lanelle Bal, MD  Electronically Signed   DZ/MEDQ  D:  12/04/2009  T:  12/05/2009  Job:  [id number]   cc:   Bonne Dolores, M.D.  Bridgette Habermann, M.D.  Champ Mungo. Lovena Le, MD

## 2011-05-14 NOTE — Assessment & Plan Note (Signed)
OFFICE VISIT   JERINE, RUFFCORN  DOB:  Sep 30, 1951                                        October 12, 2009  CHART #:  [id number]   The patient was seen in the office last week at the request of Dr.  Caron Presume because of the incidental finding of slight enlargement of the  thymus gland with weight loss.  The patient also was noted to have liver  enzyme changes consistent with obstructive biliary tract disease and is  currently being evaluated by [facility].  The patient  comes into office today and says that she is to have some study tomorrow  but does not know what it is nor why they are doing it.  The notes from  Gastroenterology did not indicate any x-rays had been ordered.  However,  in the x-ray system is a MRI of the abdomen scheduled for tomorrow.  The  patient did have pulmonary function studies done which are adequate for  mediastinal surgery should we need to proceed in this direction.  The  patient was noted to have a weight loss and elevated thyroid function  test, and has been referred to Endocrinology to assist in management of  thyroid disease.  At this point, it is unclear if the patient has any  significant disease in the chest that warrants surgery at all.  However  with the vague CT scan findings, I have made arrangements for a PET  scan, if the area of the thymus is significantly positive we will  proceed quicker to surgery.  If it is negative then we may decide to  follow this radiographically and reserve surgery only if we see some  enlargement.  Should there be any abdominal malignancy that could  explain changes in biliary function and/or weight loss, may become  evident also, we will arrange for a PET scan and plan to see the patient  back in 1 week.   Lanelle Bal, MD  Electronically Signed   EG/MEDQ  D:  10/12/2009  T:  10/13/2009  Job:  [id number]   cc:   R. Garfield Cornea, M.D.  Bonne Dolores, M.D.

## 2011-05-14 NOTE — Consult Note (Signed)
NEW PATIENT CONSULTATION   Erin Cooper, Erin Cooper  DOB:  1951/05/18                                        October 05, 2009  CHART #:  [id number]   REQUESTING PHYSICIAN:  Donnal Moat, PA, with Dr. Caron Presume and Dr.  Rockey Situ.   REASON FOR CONSULTATION:  Question of thymic tumor.   HISTORY OF PRESENT ILLNESS:  The patient is a 60 year old long-term  smoker who presented 2-3 weeks ago with increasing cough, chest cold  that she notes she could not shake and went to her primary care office.  Chest x-ray was done that showed stable exam and no evidence of active  disease.  She was started on prednisone and Levaquin with continued  improvement over a week.  At the same time, the patient mentioned that  she had lost weight.  She notes 40-pound weight loss although her chart  notes 22 weight loss since February 2009.  Because of the weight loss, a  CT scan of the chest and abdomen was performed.  The patient had a  question of a thymic mass and was referred to thoracic surgery.  In  addition, she had elevated liver function studies with question of stone  or stricture in the biliary tree.  Referred to gastroenterologist but  has not been seen yet.   PAST MEDICAL HISTORY:  She notes previous history of myocardial  infarction and was treated by Dr. Lovena Le, [facility], said she  had a clot and angioplasty.  Currently, do not have the details on  this.  She has a history of hypertension.  She notes that her lipid  status is unknown.  She denies diabetes.  He is a smoker and has been  smoking since age 27.  No previous stroke.   Previous surgery include back surgery x2, neck surgery x2 by Dr.  Arnoldo Morale, hysterectomy for carcinoma of the uterus at age 41,  cholecystectomy in 2001, appendectomy in 2000.   FAMILY HISTORY AND SOCIAL HISTORY:  As noted, the patient is a long-term  smoker.  Brother is currently dying of lymphoma.  Mother died of lung  cancer at age 30.  One  sister died of breast cancer at age 99.  Other  sister and daughter both who have had breast cancer.   CURRENT MEDICATIONS:  1. Celebrex p.r.n.  2. Hydrocodone.  3. Diazepam.  4. Flexeril.  5. Trileptal 600 b.i.d.  6. Benicar 40 a day.  7. Amlodipine 2.5 a day.  8. Meloxicam 15 mg a day.  9. Amitriptyline 50 mg a day.   Allergies include PENICILLIN which eats up her blood cells.   REVIEW OF SYSTEMS:  CARDIAC:  The patient occasionally has chest  tightness, these symptoms are vague and atypical.  She does have  exertional shortness of breath.  Denies resting shortness of breath.  Denies orthopnea, syncope, or presyncope.  Does have episodes of heart  racing.  GENERAL:  Positive for weight loss and right abdominal right upper  quadrant pain especially after eating.  No blood in her stool or urine.  No diarrhea.  Denies jaundice.  Does have chronic joint, neck, and back  pain.   PHYSICAL EXAMINATION:  General:  The patient appears to be a chronic  smoker.  Vital Signs:  Blood pressure 136/69, pulse 92, respiratory rate  is 18, O2  sats 90%.  Neck:  I do not appreciate any cervical or  supraclavicular adenopathy.  No axillary adenopathy.  Lungs:  Clear  bilaterally.  Abdomen:  She does have mild right upper quadrant  tenderness but no palpable mass.  Lower Extremities:  Without  tenderness.   DIAGNOSTICS:  The patient's chest x-ray and CT scan are reviewed.  She  has a soft tissue mass in the prevascular space arising in area of the  thymus.  This is a triangular-shaped, ill-defined area.  Considerations  include possible thymoma though it is fairly ill-defined possible  lymphoma or it may just be a significant thymus tissue that has  persisted in spite of age.  CT of the abdomen shows possible stone  versus biliary stricture.  The patient does have elevated LFTs and  alkaline phosphatase, mildly elevated bilirubin.   IMPRESSION:  A smoker with ill-defined area of increased  tissue in the  anterior mediastinal space that image wise is not obvious malignancy,  but is more prominent than should be.  At this point, I have recommend  the patient stop smoking immediately.  We will obtain pulmonary function  studies.  She has been cautioned not to take nonsteroidal anti-  inflammatories or Celebrex because in the near future we may wish to do  a biopsy.  She is to keep her appointment with gastroenterology tomorrow  as she does have a partially obstructed biliary tree with elevated liver  functions, this should be addressed first.  We will see her back in 1  week and then consider scheduling a PET scan to evaluate the anterior  mediastinal tissue.  If it is negative, we may follow this serially with  imaging or consider biopsy.   Lanelle Bal, MD  Electronically Signed   EG/MEDQ  D:  10/05/2009  T:  10/06/2009  Job:  [id number]   cc:   Bonne Dolores, M.D.
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
## 2011-05-17 NOTE — Op Note (Signed)
NAMESALLIE, ROSSITER                 ACCOUNT NO.:  [id number]   MEDICAL RECORD NO.:  [id number]          PATIENT TYPE:  INP   LOCATION:  3172                         FACILITY:  [place]   PHYSICIAN:  Ophelia Charter, M.D.DATE OF BIRTH:  Jul 05, 1951   DATE OF PROCEDURE:  01/22/2006  DATE OF DISCHARGE:                                 OPERATIVE REPORT   BRIEF HISTORY:  The patient is a 60 year old white female who suffers from  back and right leg pain consistent with a right S1 radiculopathy.  She  failed medical management and was worked up with a lumbar MRI which  demonstrated herniated disc L5-S1 on the right.  I discussed the various  treatment options with the patient including surgery.  The patient weighed  the risks, benefits, and alternatives of surgery and decided to proceed with  a right L5-S1 microdiscectomy.  The patient had prior back surgery many  years ago by another physician.   PREOPERATIVE DIAGNOSIS:  Right L5-S1 herniated nucleus pulposus, spinal  stenosis, lumbar radiculopathy, lumbago.   POSTOPERATIVE DIAGNOSIS:  Right L5-S1 herniated nucleus pulposus, spinal  stenosis, lumbar radiculopathy, lumbago.   PROCEDURE:  Right L5-S1 microdiscectomy using microdissection.   SURGEON:  Ophelia Charter, M.D.   ASSISTANT:  Leeroy Cha, M.D.   ANESTHESIA:  General endotracheal anesthesia.   ESTIMATED BLOOD LOSS:  Minimal.   SPECIMENS:  None.   DRAINS:  None.   COMPLICATIONS:  None.   DESCRIPTION OF PROCEDURE:  The patient was brought to the operating room by  the anesthesia team.  General endotracheal anesthesia was induced.  The  patient was turned to the prone position on the Wilson frame.  Her  lumbosacral  region was then shaved and prepared with Betadine scrub and  Betadine solution.  Sterile drapes were applied.  I then injected the area  to be incised with Marcaine with epinephrine solution.  I made a linear  midline incision over the L5-S1 interspace.   I used electrocautery to  perform a subperiosteal dissection exposing the right spinous process and  lamina of L5 and the upper sacrum.  We obtained interoperative radiographs  to confirm our location.   We then inserted the [facility] retractor for exposure and then brought the  operating microscope into the field.  Under instrument magnification and  illumination completed the microdissection/decompression.  We used the high  speed drill to perform a right L5 laminotomy, widening the laminotomy with  the Kerrison punch and removed the right L5-S1 ligamentum flavum.  I  performed a foraminotomy about the right S1 nerve root.  I then freed up the  thecal sac and the nerve root from the epidural tissues using  microdissection and then Dr. Joya Salm carefully retracted the thecal sac and  the right S1 nerve root medially with the Durico retractor exposing  underlying disc herniation which had migrated caudally from the L5-S1  interspace.  We removed this free fragment disc herniation using pituitary  forceps.  We then inspected the intervertebral disc at L5-S1.  It was quite  spondylotic and it had a broad base  bulging, but there was no focal disc  herniation at the disc space and so we did not enter into the intervertebral  disc space.  We palpated along the ventral surface of the thecal sac along  the S1 nerve root and noted it was well decompressed, and as above, did not  incise the intervertebral disc at the disc space as it was not causing  significant neural compression.   We obtained hemostasis using bipolar electrocautery.  We irrigated the wound  out with Bacitracin solution and removed the solution.  We removed the  [facility] retractor.  We reapproximated the thoracolumbar fascia with  interrupted #1 Vicryl suture, the subcutaneous tissue with interrupted 2-0  Vicryl suture, and the skin with Steri-Strips and Benzoin.  The wound was  then coated with Bacitracin ointment.  A sterile  dressing was applied, the  drapes were removed.  The patient was subsequently returned to the supine  position where she was extubated by the anesthesia team and transported to  the post anesthesia care unit in stable condition.  All sponge, instrument  and needle counts were correct at the end of the case.      Ophelia Charter, M.D.  Electronically Signed     JDJ/MEDQ  D:  01/22/2006  T:  01/22/2006  Job:  [id number]

## 2011-05-17 NOTE — H&P (Signed)
[place]  Patient:    Erin Cooper, Erin Cooper                        MRN: [id number] Adm. Date:  [id number] Attending:  Newman Pies D                         History and Physical  CHIEF COMPLAINT:  Neck pain, right arm pain, right shoulder weakness.  HISTORY OF PRESENT ILLNESS:  The patient is a 60 year old white female who was in her usual state of health until February 17, 2001.  It was on that date that the patient began having neck pain.  She was treated by her medical doctor with medication, she failed to improve, and she was worked up with a neck MRI which demonstrated herniated disk at C4-5 and C5-6.  I have recommended, the patient decided to proceed with surgery, and I originally had her scheduled to do surgery on March 19, 2001.  She called me two days ago and told me that approximately five days ago she bent over to tie shoes, lifted her head up, and had greatly increased pain and noticed some weakness in her right shoulder to the point where she was not able to comb her hair.  I brought her in, examined her, and noticed that her deltoid strength had decreased significantly but had not changed in several days and I therefore recommended we move the surgery up to today.  The patient complains of neck pain which radiates mainly into her right shoulder and arm and right deltoid weakness.  She has failed medical management.  PAST MEDICAL HISTORY:  Positive for uterine cancer.  She underwent lumbar herniated nucleus pulposus, cholecystitis, appendicitis.  MEDICATIONS PRIOR TO ADMISSION: 1. Celebrex 200 mg p.o. q.d. 2. Cyclobenzaprine 10 mg p.o. q.8h. p.r.n. 3. P.r.n. pain medications.  DRUG ALLERGIES:  CODEINE causes hallucination; PENICILLIN "eats my red blood cells."  FAMILY MEDICAL HISTORY:  The patients mother died at age 58 of COPD.  The patients father is age 42 in good health.  SOCIAL HISTORY:  The patient is married, she has  three children, she lives in [place].  She denies ethanol or drug use.  She has a 25 pack-year history of smoking but she quit smoking November 2001 - I highly advised her to continue to refrain from smoking to decrease her chance of developing a pseudoarthrosis.  REVIEW OF SYSTEMS:  Negative except as above.  PHYSICAL EXAMINATION:  GENERAL:  A pleasant, well-nourished, well-developed 60 year old white female complaining of neck pain and right shoulder and arm pain and weakness.  Height 5 feet 6 inches, weight 140 pounds.  HEENT:  Normal.  NECK:  There are no masses, deformities, tracheal deviation, jugular venous distention.  She has a limited cervical range of motion.  Spurlings test was positive on the right and negative on the left.  Lhermittes sign was not present.  THORAX:  Symmetric.  LUNGS:  Clear to auscultation.  HEART:  Regular rate and rhythm.  ABDOMEN:  Soft and nontender.  EXTREMITIES:  No obvious deformities.  BACK:  She has a well-healed lumbar incision, no deformities.  NEUROLOGIC:  The patient is alert and oriented x 3.  Cranial nerves 2-12 are grossly intact bilaterally.  Vision and hearing are grossly normal bilaterally.  Motor strength is 5/5 in her bilateral biceps, triceps, hand grip, wrist extensor, inner osseus, psoas, quadriceps, gastrocnemius, extensor hallucis  longus, and left deltoid.  Her right deltoid strength is 2/5. Sensory exam demonstrates decreased sensation at the right C5 distribution; otherwise, unremarkable.  Deep tendon reflexes are trace in her bilateral quadriceps, absent bilateral gastrocnemius, 2/4 in her bilateral biceps and triceps.  She has bilateral flexor plantar reflexes.  No active clonus.  IMAGING STUDIES:  The patient has a cervical MRI performed with and without contrast at [facility] on February 23, 2001.  The sagittal images demonstrate a straightened cervical spine.  She has a large disk herniation  at C4-5 and a right-sided herniated nucleus pulposus at C5-6.  ASSESSMENT AND PLAN:  C4-5 and C5-6 herniated nucleus pulposus, degenerative disk disease, spondylosis, spinal stenosis, cervicalgia.  I have discussed the situation with the patient and reviewed her MRI scan with her, pointed out the abnormalities.  Particularly in light of her right deltoid weakness, I have recommended that she undergo a C4-5 and C5-6 anterior cervical diskectomy, interbody iliac crest allograft arthrodesis, anterior cervical plating.  I have described the surgical procedure to her, shown her surgical models, and discussed the risks of surgery extensively.  The patient has weighed the risks, benefits, and alternatives of surgery and decided to proceed with the operation.  I explained to her that given the fact that her deltoid is significantly weak, she may never gain her strength in her right deltoid; in fact, it could worsen.  She understands this. DD:  03/18/01 TD:  03/19/01 Job: [id number]

## 2011-05-17 NOTE — Procedures (Signed)
Erin Cooper, Erin Cooper                 ACCOUNT NO.:  [id number]   MEDICAL RECORD NO.:  [id number]          PATIENT TYPE:  OUT   LOCATION:  RAD                           FACILITY:  [facility]   PHYSICIAN:  Edward L. Luan Pulling, M.D.DATE OF BIRTH:  1951/05/15   DATE OF PROCEDURE:  DATE OF DISCHARGE:  05/01/2006                              PULMONARY FUNCTION TEST   PROCEDURE:  Pulmonary function test.   1.  Spirometry shows a mild to moderate ventilatory defect with evidence of      airflow obstruction.  2.  Lung volumes are normal.  3.  DLCO is normal .  4.  There is no significant bronchodilator response.      Edward L. Luan Pulling, M.D.  Electronically Signed     ELH/MEDQ  D:  05/06/2006  T:  05/07/2006  Job:  [id number]

## 2011-05-17 NOTE — Op Note (Signed)
[place]. [facility]  Patient:    Erin Cooper, Erin Cooper                        MRN: [id number] Proc. Date: 03/18/01 Adm. Date:  [id number] Attending:  Newman Pies D                           Operative Report  PREOPERATIVE DIAGNOSIS:  C4-5 and C5-6 herniated nucleus pulposus, spondylosis, spinal stenosis, degenerative disk disease with cervical radiculopathy.  POSTOPERATIVE DIAGNOSIS:  C4-5 and C5-6 herniated nucleus pulposus, spondylosis, spinal stenosis, degenerative disk disease with cervical radiculopathy.  OPERATION PERFORMED:  C4-5 and C5-6 extensive anterior cervical diskectomy, interbody iliac crest allograft arthrodesis, anterior cervical plating C4 to C6 (Codman titanium anterior plate and screws).  SURGEON:  Ophelia Charter, M.D.  ASSISTANT:  Marchia Meiers. Vertell Limber, M.D.  ANESTHESIA:  General endotracheal.  INDICATIONS FOR PROCEDURE:  The patient is a 60 year old white female who has suffered from neck and right shoulder and arm pain and weakness.  She failed medical management and was worked up as an outpatient with cervical MRI which demonstrated a large disk herniation at C4-5 and to a lesser extent at C5-6. I discussed the various treatment options with her including doing nothing, continuing medical management and surgery.  The patient weighed the risks, benefits and alternatives to surgery and decided to proceed with a C4-5 and C5-6 anterior cervical diskectomy and fusion and plating.  ESTIMATED BLOOD LOSS:  400 cc.  SPECIMENS:  None.  DRAINS:  None.  COMPLICATIONS:  None.  DESCRIPTION OF PROCEDURE:  The patient was brought to the operating room by the anesthesia team.  General endotracheal anesthesia was induced.  The patient remained in supine position.  A roll was placed on her shoulders to place her neck in slight extension.  Her anterior cervical region was then prepared with Betadine scrub and betadine solution and sterile  drapes were applied.  I then injected the area to be incised with Marcaine with epinephrine solution and used a scalpel to make a transverse incision from the patients left anterior neck.  I used the Metzenbaum scissors to dissect down through the platysma muscle.  I divided it along the direction of the skin incision.  I then dissected medial to the sternocleidomastoid muscle, jugular vein and carotid artery.  I bluntly dissected down to the anterior cervical spine carefully identifying the esophagus and retracting it medially.  I cleared the soft tissue from the anterior cervical spine using Kittner swabs and then obtained the intraoperative radiograph which demonstrated the needle was at C6-7.  I then used electrocautery to detach the medial border of the longus colli muscle bilaterally from the C5-6 and C4-5 intervertebral disk space.  I inserted the Caspar self-retaining retractor for exposure and then I began at C4-5.  I used a 15 blade scalpel to incise the C4-5 intervertebral disk.  I performed a partial diskectomy with the pituitary forceps.  I then inserted distraction screws at C4 and C5 distracted the interspace and used the pituitary forceps and Carlens curets to remove more of the intervertebral disk.  I then used a high speed drill to decorticate the vertebral end plates at D34-534 and to drill away the remainder of the intervertebral disk.  I then thinned out the posterior longitudinal ligament with the drill and then incised it with the arachnoid knife.  I encountered a huge ruptured disk  at C4-5 on the right which was severely compressing the right C5 nerve root.  I removed the remainder of the posterior longitudinal ligament with the Kerrison punch and removed the herniated disk with the pituitary forceps and the Kerrison punch undercutting the vertebral end plates at D34-534 and performing a foraminotomy about the bilateral C5 nerve root.  I got good decompression of the  bilateral C5 nerve roots.  I then repeated this procedure at C5-6.  I removed his distraction screw from C4 and placed into C6, resected the C5-6 interspace, incised the disk with a 15 blade scalpel.  I performed a partial diskectomy with pituitary forceps and the Carlens curets and then drilled away the remainder of the intervertebral disk with a high speed drill, decorticating the vertebral end plates at 075-GRM and thinned down the posterior longitudinal ligament.  I removed the remainder of the ligament with Kerrison punch undercutting the vertebral end plates at 075-GRM, decompressing the thecal sac and performed a foraminotomy about the bilaterally C6 nerve root.  Having completed the extensive anterior cervical diskectomy at C4-5 and C5-6, I now turned my attention to the interbody allograft arthrodesis. I obtained iliac crest cortical allograft bone graft and fashioned these to the approximate dimensions, 1 cm in depth, 7 mm in height.  I then inserted one bone graft at the distracted C5-6 interspace, removed the distraction screw from C6 and placed it back in C4, distracted C4-5 and placed the other bone graft in C4-5.  I removed the distraction screws.  There was good snug fit of the bone graft at both levels.  I completed the arthrodesis.  I now turned my attention to anterior spinal instrumentation.  I obtained the appropriate length Codman anterior cervical plate, laid it along the anterior aspect of the vertebral bodies from C4 to C6 after I removed some anterior bone spurs with the high speed drill.  I then drilled two holes in C4, two in C5, two in C6, tapped these holes and then secured the plate to the vertebral bodies, put 12 mm screws, i.e. two at each level.  I obtained the intraoperative radiograph.  It demonstrated good position of the plates, screws and interbody graft.  I then secured the screws to the plate using the Cam tightner.  I then copiously irrigated the  wound with bacitracin solution, removed the solution, I achieved stringent hemostasis with bipolar electrocautery and Gelfoam.  I removed the Caspar self-retaining retractor and then inspected the esophagus for any damage.  There was none.  I then reapproximated the patients platysma muscle with interrupted 3-0 Vicryl, subcutaneous tissue with interrupted 3-0 Vicryl suture and the skin with Steri-Strips and benzoin. The wound was then coated with bacitracin ointment and sterile dressings were applied.  The drapes were removed.  The patient was subsequently extubated by the anesthesia team and transported to post anesthesia care unit in stable condition.  Sponge, needle and instrument counts were correct at the end of the case. DD:  03/18/01 TD:  03/19/01 Job: [id number]

## 2011-10-24 ENCOUNTER — Encounter: Payer: Self-pay | Admitting: Internal Medicine

## 2013-05-12 ENCOUNTER — Other Ambulatory Visit (HOSPITAL_COMMUNITY): Payer: Self-pay | Admitting: Family Medicine

## 2013-05-12 ENCOUNTER — Ambulatory Visit (HOSPITAL_COMMUNITY)
Admission: RE | Admit: 2013-05-12 | Discharge: 2013-05-12 | Disposition: A | Discharge status: Home / Self Care | Payer: Medicare Other | Source: Ambulatory Visit | Attending: Family Medicine | Admitting: Family Medicine

## 2013-05-12 DIAGNOSIS — R229 Localized swelling, mass and lump, unspecified: Secondary | ICD-10-CM | POA: Insufficient documentation

## 2013-05-12 DIAGNOSIS — R221 Localized swelling, mass and lump, neck: Secondary | ICD-10-CM

## 2013-05-12 DIAGNOSIS — R059 Cough, unspecified: Secondary | ICD-10-CM | POA: Insufficient documentation

## 2013-05-12 DIAGNOSIS — R05 Cough: Secondary | ICD-10-CM | POA: Insufficient documentation

## 2013-05-12 DIAGNOSIS — R220 Localized swelling, mass and lump, head: Secondary | ICD-10-CM

## 2013-05-12 DIAGNOSIS — R918 Other nonspecific abnormal finding of lung field: Secondary | ICD-10-CM | POA: Insufficient documentation

## 2013-05-26 ENCOUNTER — Other Ambulatory Visit: Payer: Self-pay | Admitting: *Deleted

## 2013-05-26 DIAGNOSIS — I2510 Atherosclerotic heart disease of native coronary artery without angina pectoris: Secondary | ICD-10-CM

## 2013-05-27 ENCOUNTER — Encounter: Payer: Self-pay | Admitting: Cardiothoracic Surgery

## 2013-05-27 ENCOUNTER — Other Ambulatory Visit: Payer: Self-pay | Admitting: *Deleted

## 2013-05-27 ENCOUNTER — Ambulatory Visit
Admission: RE | Admit: 2013-05-27 | Discharge: 2013-05-27 | Disposition: A | Discharge status: Home / Self Care | Payer: Medicare Other | Source: Ambulatory Visit | Attending: Cardiothoracic Surgery | Admitting: Cardiothoracic Surgery

## 2013-05-27 ENCOUNTER — Ambulatory Visit (INDEPENDENT_AMBULATORY_CARE_PROVIDER_SITE_OTHER): Payer: Medicare Other | Admitting: Cardiothoracic Surgery

## 2013-05-27 VITALS — BP 122/77 | HR 79 | Resp 20 | Ht 66.0 in | Wt 141.0 lb

## 2013-05-27 DIAGNOSIS — J9859 Other diseases of mediastinum, not elsewhere classified: Secondary | ICD-10-CM

## 2013-05-27 DIAGNOSIS — I2510 Atherosclerotic heart disease of native coronary artery without angina pectoris: Secondary | ICD-10-CM

## 2013-05-27 DIAGNOSIS — R221 Localized swelling, mass and lump, neck: Secondary | ICD-10-CM

## 2013-05-27 DIAGNOSIS — R220 Localized swelling, mass and lump, head: Secondary | ICD-10-CM

## 2013-05-27 NOTE — Progress Notes (Signed)
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  History of Present Illness:     The patient returns to the office today after having had sudden pain in her right clavicular joint and some swelling several weeks ago. It has improved but is still somewhat tender. She is referred by Dr. Hilma Favors both in followup of her previous surgical resection and clavicular discomfort. The patient is a long-term smoker at least 45 years, but she has not been smoking for the past year and a half.          History  Smoking status  . Current Every Day Smoker -- 0.50 packs/day  Smokeless tobacco  . Not on file       Allergies  Allergen Reactions  . Morphine   . Penicillins     Current Outpatient Prescriptions  Medication Sig Dispense Refill  . albuterol (PROVENTIL) (2.5 MG/3ML) 0.083% nebulizer solution Take 2.5 mg by nebulization as needed.        Marland Kitchen amitriptyline (ELAVIL) 50 MG tablet Take 50 mg by mouth at bedtime.        Marland Kitchen amLODipine (NORVASC) 5 MG tablet Take 5 mg by mouth daily.        Marland Kitchen levofloxacin (LEVAQUIN) 500 MG tablet Take 500 mg by mouth daily.       Marland Kitchen olmesartan (BENICAR) 40 MG tablet Take 40 mg by mouth daily.        Marland Kitchen oxcarbazepine (TRILEPTAL) 600 MG tablet Take 600 mg by mouth 2 (two) times daily.        Marland Kitchen SYNTHROID 88 MCG tablet Take 88 mcg by mouth daily before breakfast.        No current facility-administered medications for this visit.       Physical Exam: BP 122/77  Pulse 79  Resp 20  Ht 5\' 6"  (1.676 m)  Wt 141 lb (63.957 kg)  BMI 22.77 kg/m2  SpO2 97%  General appearance: alert,  cooperative and appears stated age Neurologic: intact Heart: regular rate and rhythm, S1, S2 normal, no murmur, click, rub or gallop and normal apical impulse Lungs: clear to auscultation bilaterally and normal percussion bilaterally Abdomen: soft, non-tender; bowel sounds normal; no masses,  no organomegaly Extremities: extremities normal, atraumatic, no cyanosis or edema and Homans sign is negative, no sign of DVT Wound: The upper sternal incision is well-healed, the wires are palpable the right sternoclavicular joint is slightly more prominent than the left but is not unstable Do not appreciate cervical or supraclavicular adenopathy   Diagnostic Studies & Laboratory data:         Recent Radiology Findings: Dg Chest 2 View  05/27/2013   *RADIOLOGY REPORT*  Clinical Data: CAD, cough, recent diagnosis of pneumonia, smoker  CHEST - 2 VIEW  Comparison: 05/12/2013; 06/27/2010; chest CT of 06/27/2010  Findings:  Grossly unchanged cardiac silhouette and mediastinal contours post median sternotomy.  Atherosclerotic calcifications within the aortic arch. Calcified subcarinal lymph nodes as demonstrated on recent chest CT.  The lungs remain hyperexpanded with flattening of  bilateral hemidiaphragms and mild diffuse thickening of the pulmonary interstitium.  Improved aeration of the left lower lung. No focal airspace opacity.  No pleural effusion or pneumothorax. No evidence of edema.  Unchanged bones including lower cervical ACDF, incompletely evaluated.  Post cholecystectomy.  IMPRESSION: 1.  Hyperexpanded lungs and bronchitic change without acute cardiopulmonary disease.  2. Improved aeration of the left lower lung suggests resolved infection and/or atelectasis could   Original Report Authenticated By: Jake Seats, MD      Recent Labs: Lab Results  Component Value Date   WBC 6.3 07/07/2010   HGB 12.6 07/07/2010   HCT 36.3 07/07/2010   PLT 200 07/07/2010   GLUCOSE 88 07/09/2010   CHOL  Value: 292         ATP III CLASSIFICATION:  <200     mg/dL   Desirable  200-239  mg/dL   Borderline High  >=240    mg/dL   High       * 07/07/2010   TRIG 122 07/07/2010   HDL 91 07/07/2010   LDLCALC  Value: 177        Total Cholesterol/HDL:CHD Risk Coronary Heart Disease Risk Table                     Men   Women  1/2 Average Risk   3.4   3.3  Average Risk       5.0   4.4  2 X Average Risk   9.6   7.1  3 X Average Risk  23.4   11.0        Use the calculated Patient Ratio above and the CHD Risk Table to determine the patient's CHD Risk.        ATP III CLASSIFICATION (LDL):  <100     mg/dL   Optimal  100-129  mg/dL   Near or Above                    Optimal  130-159  mg/dL   Borderline  160-189  mg/dL   High  >190     mg/dL   Very High* 07/07/2010   ALT 33 07/07/2010   AST 61* 07/07/2010   NA 136 07/09/2010   K 3.8 07/09/2010   CL 103 07/09/2010   CREATININE 0.87 07/09/2010   BUN 6 07/09/2010   CO2 26 07/09/2010   TSH 84.898 Test methodology is 3rd generation TSH 07/06/2010   INR 0.87 11/01/2009      Assessment / Plan:     Status post resection of thymic mass 2010, which was reported pathologically benign at that time With the patient's discomfort in the right supraclavicular, and equivocal findings on a CT scan done in the emergency room in 2011 I've recommended to the patient that we repeat her CT of the chest both to evaluate the right upper chest and the previous resection site. She'll obtain a CT scan and then follow up to see me in 2-3 weeks  2011 [facility] TSH in [facility] was 84.8, she notes that this is been actively followed and treated.   Connie Lasater B 05/27/2013 11:27 AM

## 2013-06-01 ENCOUNTER — Ambulatory Visit (HOSPITAL_COMMUNITY): Payer: Medicare Other

## 2013-06-04 ENCOUNTER — Ambulatory Visit (HOSPITAL_COMMUNITY)
Admission: RE | Admit: 2013-06-04 | Discharge: 2013-06-04 | Disposition: A | Discharge status: Home / Self Care | Payer: Medicare Other | Source: Ambulatory Visit | Attending: Cardiothoracic Surgery | Admitting: Cardiothoracic Surgery

## 2013-06-04 DIAGNOSIS — J8410 Pulmonary fibrosis, unspecified: Secondary | ICD-10-CM | POA: Insufficient documentation

## 2013-06-04 DIAGNOSIS — J9859 Other diseases of mediastinum, not elsewhere classified: Secondary | ICD-10-CM

## 2013-06-04 DIAGNOSIS — E328 Other diseases of thymus: Secondary | ICD-10-CM | POA: Insufficient documentation

## 2013-06-04 DIAGNOSIS — Z923 Personal history of irradiation: Secondary | ICD-10-CM | POA: Insufficient documentation

## 2013-06-04 DIAGNOSIS — M25519 Pain in unspecified shoulder: Secondary | ICD-10-CM | POA: Insufficient documentation

## 2013-06-04 DIAGNOSIS — J438 Other emphysema: Secondary | ICD-10-CM | POA: Insufficient documentation

## 2013-06-04 MED ORDER — IOHEXOL 300 MG/ML  SOLN
80.0000 mL | Freq: Once | INTRAMUSCULAR | Status: AC | PRN
  Administered 2013-06-04: 80 mL via INTRAVENOUS

## 2013-06-10 ENCOUNTER — Encounter: Payer: Self-pay | Admitting: Cardiothoracic Surgery

## 2013-06-17 ENCOUNTER — Ambulatory Visit: Payer: Medicare Other | Admitting: Cardiothoracic Surgery

## 2013-06-29 ENCOUNTER — Other Ambulatory Visit: Payer: Self-pay | Admitting: *Deleted

## 2013-06-29 DIAGNOSIS — J9859 Other diseases of mediastinum, not elsewhere classified: Secondary | ICD-10-CM

## 2013-07-01 ENCOUNTER — Ambulatory Visit: Payer: Medicare Other | Admitting: Cardiothoracic Surgery

## 2013-07-12 ENCOUNTER — Other Ambulatory Visit: Payer: Self-pay | Admitting: *Deleted

## 2013-07-12 DIAGNOSIS — R911 Solitary pulmonary nodule: Secondary | ICD-10-CM

## 2013-07-15 ENCOUNTER — Ambulatory Visit: Payer: Medicare Other | Admitting: Cardiothoracic Surgery

## 2013-07-16 ENCOUNTER — Encounter: Payer: Self-pay | Admitting: Cardiothoracic Surgery

## 2013-07-16 ENCOUNTER — Ambulatory Visit (INDEPENDENT_AMBULATORY_CARE_PROVIDER_SITE_OTHER): Payer: Medicare Other | Admitting: Cardiothoracic Surgery

## 2013-07-16 VITALS — BP 125/80 | HR 78 | Resp 16 | Ht 67.0 in | Wt 136.0 lb

## 2013-07-16 DIAGNOSIS — Z9089 Acquired absence of other organs: Secondary | ICD-10-CM

## 2013-07-16 DIAGNOSIS — M25511 Pain in right shoulder: Secondary | ICD-10-CM

## 2013-07-16 DIAGNOSIS — Z9889 Other specified postprocedural states: Secondary | ICD-10-CM

## 2013-07-16 DIAGNOSIS — M25519 Pain in unspecified shoulder: Secondary | ICD-10-CM

## 2013-07-16 NOTE — Progress Notes (Signed)
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  History of Present Illness:     The patient was seen in the office several weeks ago after having had sudden pain in her right clavicular joint and some swelling several weeks ago. It has improved but is still somewhat tender. She is referred by Dr. Hilma Favors both in followup of her previous surgical resection and clavicular discomfort. The patient is a long-term smoker at least 45 years, but she has not been smoking for the past year and a half.  Primary care started patient on Levaquin and inhaler for cough and congestion 5 days ago. Improving now  A CT scan of the chest was performed to evaluate the clavicle and followup of her previous mediastinal tumor resection. She notes since she originally had pain in late May in the right clavicle that this has resolved and no longer bothers her.    History  Smoking status  . Current Every Day Smoker -- 0.50 packs/day  Smokeless tobacco  . Not on file       Allergies  Allergen Reactions  . Morphine   . Penicillins     Current Outpatient Prescriptions  Medication Sig Dispense Refill  . albuterol (PROVENTIL) (2.5 MG/3ML) 0.083% nebulizer solution Take 2.5 mg by nebulization as needed.        Marland Kitchen amitriptyline (ELAVIL) 50 MG tablet Take 50 mg by mouth at bedtime.        Marland Kitchen amLODipine (NORVASC) 5 MG tablet Take 5 mg by mouth daily.        Marland Kitchen levofloxacin (LEVAQUIN) 500 MG tablet Take 500 mg by mouth daily.       Marland Kitchen olmesartan (BENICAR) 40 MG tablet Take 40 mg by mouth daily.        Marland Kitchen oxcarbazepine (TRILEPTAL) 600 MG tablet  Take 600 mg by mouth 2 (two) times daily.        Marland Kitchen SYNTHROID 88 MCG tablet Take 88 mcg by mouth daily before breakfast.        No current facility-administered medications for this visit.       Physical Exam: BP 125/80  Pulse 78  Resp 16  Ht 5\' 7"  (1.702 m)  Wt 136 lb (61.689 kg)  BMI 21.3 kg/m2  SpO2 97%  Some congestion/nasal General appearance: alert, cooperative and appears stated age Neurologic: intact Heart: regular rate and rhythm, S1, S2 normal, no murmur, click, rub or gallop and normal apical impulse Lungs: clear t congestion with wheezing on  auscultation bilaterally and normal percussion bilaterally Abdomen: soft, non-tender; bowel sounds normal; no masses,  no organomegaly Extremities: extremities normal, atraumatic, no cyanosis or edema and Homans sign is negative, no sign of DVT Wound: The upper sternal incision is well-healed, the wires are palpable the right sternoclavicular joint is slightly more prominent than the left but is not unstable Do not appreciate cervical or supraclavicular adenopathy   Diagnostic Studies & Laboratory data:         Recent Radiology Findings: 06/04/2013 RADIOLOGY REPORT*  Clinical Data: Mediastinal thymic  mass resection. Status post  radiation therapy. Clavicular/sternoclavicular joint pain on the  right.  CT CHEST WITH CONTRAST  Technique: Multidetector CT imaging of the chest was performed  following the standard protocol during bolus administration of  intravenous contrast.  Contrast: 45mL OMNIPAQUE IOHEXOL 300 MG/ML SOLN  Comparison: Chest radiograph 05/27/2013 and CT chest 06/27/2010.  Findings: Calcified subcarinal lymph node again noted.  Noncalcified mediastinal lymph nodes are sub centimeter in size.  Calcified right hilar lymph node. No axillary adenopathy.  Coronary artery calcification. Heart size normal. No pericardial  effusion. Probable tiny calcified lymph nodes adjacent to the  distal esophagus.  Biapical  pleural parenchymal scarring. Centrilobular emphysema.  Scattered tiny pulmonary nodular densities measure up to 4 mm in  the left upper lobe and are unchanged from 06/27/2010. Calcified  granuloma in the right lower lobe. Lungs are otherwise clear. No  pleural fluid. Minimal dependent debris is seen in the trachea.  Incidental imaging of the upper abdomen shows no acute findings.  Left renal cortical scarring. No worrisome lytic or sclerotic  lesions. Degenerative changes are scattered in the spine.  Scattered bone islands. No fracture. Acromioclavicular and  sternoclavicular joints are intact and appear unchanged from  06/27/2010.  IMPRESSION:  No evidence of recurrent or metastatic disease. No findings to  explain the patient's right clavicular/sternoclavicular joint pain.  Original Report Authenticated By: Lorin Picket, M.D.   Recent Labs: Lab Results  Component Value Date   WBC 6.3 07/07/2010   HGB 12.6 07/07/2010   HCT 36.3 07/07/2010   PLT 200 07/07/2010   GLUCOSE 88 07/09/2010   CHOL  Value: 292        ATP III CLASSIFICATION:  <200     mg/dL   Desirable  200-239  mg/dL   Borderline High  >=240    mg/dL   High       * 07/07/2010   TRIG 122 07/07/2010   HDL 91 07/07/2010   LDLCALC  Value: 177        Total Cholesterol/HDL:CHD Risk Coronary Heart Disease Risk Table                     Men   Women  1/2 Average Risk   3.4   3.3  Average Risk       5.0   4.4  2 X Average Risk   9.6   7.1  3 X Average Risk  23.4   11.0        Use the calculated Patient Ratio above and the CHD Risk Table to determine the patient's CHD Risk.        ATP III CLASSIFICATION (LDL):  <100     mg/dL   Optimal  100-129  mg/dL   Near or Above                    Optimal  130-159  mg/dL   Borderline  160-189  mg/dL   High  >190     mg/dL   Very High* 07/07/2010   ALT 33 07/07/2010   AST 61* 07/07/2010   NA 136 07/09/2010   K 3.8 07/09/2010   CL 103 07/09/2010   CREATININE 0.87 07/09/2010   BUN 6 07/09/2010   CO2 26 07/09/2010   TSH  84.898 Test methodology is 3rd generation TSH 07/06/2010   INR 0.87 11/01/2009      Assessment / Plan:     Status post resection of thymic mass 2010,  which was reported pathologically benign at that time Acute bronchitis being treated with Levaquin and inhalers for the past 5 days, improving CT scan shows no recurrence of thymic mass resected previously, she does have 3-4 mm pulmonary nodules in the left lung unchanged since 2011 She has been a heavy smoker in the past for more than 30 years, but not actively smoking now.  2011 her TSH in the [facility] was 84.8, she notes that this is been actively followed and treated.  Because of her heavy smoking history and presents of small pulmonary nodules (although unchanged since 2011) I have recommended a low dose screening CT of the chest in one year.  Casaundra Takacs B 07/16/2013 1:38 PM

## 2013-10-13 ENCOUNTER — Other Ambulatory Visit (HOSPITAL_COMMUNITY): Payer: Self-pay | Admitting: Family Medicine

## 2013-10-13 ENCOUNTER — Ambulatory Visit (HOSPITAL_COMMUNITY)
Admission: RE | Admit: 2013-10-13 | Discharge: 2013-10-13 | Disposition: A | Discharge status: Home / Self Care | Payer: Medicare Other | Source: Ambulatory Visit | Attending: Family Medicine | Admitting: Family Medicine

## 2013-10-13 DIAGNOSIS — Z139 Encounter for screening, unspecified: Secondary | ICD-10-CM

## 2013-10-13 DIAGNOSIS — E785 Hyperlipidemia, unspecified: Secondary | ICD-10-CM

## 2013-10-13 DIAGNOSIS — R937 Abnormal findings on diagnostic imaging of other parts of musculoskeletal system: Secondary | ICD-10-CM | POA: Insufficient documentation

## 2013-10-13 DIAGNOSIS — M25562 Pain in left knee: Secondary | ICD-10-CM

## 2013-10-13 DIAGNOSIS — I10 Essential (primary) hypertension: Secondary | ICD-10-CM

## 2013-10-13 DIAGNOSIS — M25569 Pain in unspecified knee: Secondary | ICD-10-CM | POA: Insufficient documentation

## 2013-10-19 ENCOUNTER — Other Ambulatory Visit: Payer: Self-pay | Admitting: Family Medicine

## 2013-10-19 DIAGNOSIS — Z803 Family history of malignant neoplasm of breast: Secondary | ICD-10-CM

## 2013-10-19 DIAGNOSIS — Z1231 Encounter for screening mammogram for malignant neoplasm of breast: Secondary | ICD-10-CM

## 2013-10-28 ENCOUNTER — Ambulatory Visit
Admission: RE | Admit: 2013-10-28 | Discharge: 2013-10-28 | Disposition: A | Discharge status: Home / Self Care | Payer: Medicare Other | Source: Ambulatory Visit | Attending: Family Medicine | Admitting: Family Medicine

## 2013-10-28 DIAGNOSIS — Z1231 Encounter for screening mammogram for malignant neoplasm of breast: Secondary | ICD-10-CM

## 2013-10-28 DIAGNOSIS — Z803 Family history of malignant neoplasm of breast: Secondary | ICD-10-CM

## 2013-11-04 ENCOUNTER — Other Ambulatory Visit: Payer: Self-pay

## 2014-04-20 ENCOUNTER — Encounter: Payer: Self-pay | Admitting: Cardiology

## 2014-04-20 ENCOUNTER — Ambulatory Visit (INDEPENDENT_AMBULATORY_CARE_PROVIDER_SITE_OTHER): Payer: Medicare Other | Admitting: Cardiology

## 2014-04-20 VITALS — BP 120/62 | HR 80 | Ht 65.0 in | Wt 145.0 lb

## 2014-04-20 DIAGNOSIS — I10 Essential (primary) hypertension: Secondary | ICD-10-CM

## 2014-04-20 DIAGNOSIS — R55 Syncope and collapse: Secondary | ICD-10-CM

## 2014-04-20 DIAGNOSIS — I2510 Atherosclerotic heart disease of native coronary artery without angina pectoris: Secondary | ICD-10-CM

## 2014-04-20 NOTE — Assessment & Plan Note (Signed)
Single episode of syncope preceded by chest pain back in March, etiology not certain. She has had no recurrences and feels at baseline since then. No exertional chest pain or unusual shortness of breath on a regular basis. ECG done today is overall nonspecific. She does not recall any palpitations with this event. In review of her cardiac history she did have a reassuring ischemic workup 5 years ago. I offered to repeat this versus observation, and she has elected to follow things for now. We will arrange an office visit over the next 3 months, sooner if symptoms recur.

## 2014-04-20 NOTE — Assessment & Plan Note (Signed)
Reports history of remote angioplasty. Details are not clear Negative ischemic workup in 2010.

## 2014-04-20 NOTE — Assessment & Plan Note (Signed)
Blood pressure is normal today. 

## 2014-04-20 NOTE — Patient Instructions (Signed)
Your physician recommends that you schedule a follow-up appointment in: 3 months with Dr Ferne Reus will receive a reminder letter two months in advance reminding you to call and schedule your appointment. If you don't receive this letter, please contact our office.  Your physician recommends that you continue on your current medications as directed. Please refer to the Current Medication list given to you today.

## 2014-04-20 NOTE — Progress Notes (Signed)
Clinical Summary Erin Cooper is a 63 y.o.female referred to the office for cardiology consultation by Dr. Hilma Favors. She was last seen in 2010 for a preoperative evaluation. She relates an episode of chest pain and syncope that occurred back before Easter this year. She was in a store shopping with a friend, suddenly developed a feeling of "closing in" and shortness of breath. This was fairly quickly followed by an episode of brief syncope, states that her friend caughtt her and kept her from falling. Since that time she has had no recurrent events, reports no exertional chest pain, no dizziness, no palpitations. She reports compliance with her medications. Has been going about her usual activities without difficulty.  She is followed by Dr. 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  Exercise Myoview from November 2010 showed no diagnostic ST segment changes, breast attenuation without evidence of scar or ischemia, LVEF 68%. The details of her prior cardiac history are not clear. She states that she was managed at [facility] and also in [place]. Reports having an angioplasty years ago.  I do not have the actual tracing from when she saw Dr. Hilma Favors, we requested the ECG but were told that it was not available. The notes indicate that there were T-wave changes. Today's tracing shows sinus rhythm with small R. prime in leads V1 and V2, short PR interval, otherwise normal.   Allergies  Allergen Reactions  . Morphine   . Penicillins     Current Outpatient Prescriptions  Medication Sig Dispense Refill  . albuterol (PROVENTIL) (2.5 MG/3ML) 0.083% nebulizer solution Take 2.5 mg by nebulization as needed.        Marland Kitchen amitriptyline (ELAVIL) 50 MG tablet Take 50 mg by mouth at bedtime.        Marland Kitchen amLODipine (NORVASC) 5 MG tablet Take 2.5 mg by mouth daily.       Marland Kitchen losartan (COZAAR) 100 MG tablet Take 100 mg by mouth daily.       Marland Kitchen  oxcarbazepine (TRILEPTAL) 600 MG tablet Take 300 mg by mouth 2 (two) times daily.       Marland Kitchen SYNTHROID 88 MCG tablet Take 88 mcg by mouth daily before breakfast.        No current facility-administered medications for this visit.    Past Medical History  Diagnosis Date  . Essential hypertension, benign   . COPD (chronic obstructive pulmonary disease)   . Coronary atherosclerosis of native coronary artery     Possible diagnosis - details not complete  . History of uterine cancer     Age 77  . Chronic neck pain   . Chronic back pain   . History of MRSA infection     Past Surgical History  Procedure Laterality Date  . Back surgery      x2  . Neck surgery      x2  . Cholecystectomy  2001  . Partial hysterectomy      Age 26, uterine cancer  . Appendectomy  2000  . Thyroid surgery    . Tumor excision      Chest  . Thymectomy      11/06/2009, Dr Servando Snare     Family History  Problem Relation Age of Onset  . Lung cancer Mother   . Heart failure Father   . Cirrhosis Father     Social History Erin Cooper reports that she has been smoking Cigarettes.  She has been smoking about 0.50  packs per day. She does not have any smokeless tobacco history on file. Erin Cooper reports that she does not drink alcohol.  Review of Systems Negative except as outlined above.  Physical Examination Filed Vitals:   04/20/14 1413  BP: 120/62  Pulse: 80   Filed Weights   04/20/14 1413  Weight: 145 lb (65.772 kg)   Patient appears comfortable at rest. HEENT: Conjunctiva and lids normal, oropharynx clear. Neck: Supple, no elevated JVP or carotid bruits, no thyromegaly. Lungs: Clear to auscultation, nonlabored breathing at rest. Cardiac: Regular rate and rhythm, no S3 or significant systolic murmur, no pericardial rub. Abdomen: Soft, nontender, bowel sounds present. Extremities: No pitting edema, distal pulses 2+. Skin: Warm and dry. Musculoskeletal: No kyphosis. Neuropsychiatric: Alert and  oriented x3, affect grossly appropriate.   Problem List and Plan   Syncope Single episode of syncope preceded by chest pain back in March, etiology not certain. She has had no recurrences and feels at baseline since then. No exertional chest pain or unusual shortness of breath on a regular basis. ECG done today is overall nonspecific. She does not recall any palpitations with this event. In review of her cardiac history she did have a reassuring ischemic workup 5 years ago. I offered to repeat this versus observation, and she has elected to follow things for now. We will arrange an office visit over the next 3 months, sooner if symptoms recur.  CORONARY ATHEROSCLEROSIS NATIVE CORONARY ARTERY Reports history of remote angioplasty. Details are not clear Negative ischemic workup in 2010.  Essential hypertension, benign Blood pressure is normal today.    Satira Sark, M.D., F.A.C.C.

## 2014-04-21 NOTE — Addendum Note (Signed)
Addended by: Truett Mainland on: 04/21/2014 03:02 PM   Modules accepted: Orders

## 2014-04-21 NOTE — Addendum Note (Signed)
Addended by: Barbarann Ehlers A on: 04/21/2014 03:30 PM   Modules accepted: Orders

## 2014-06-29 ENCOUNTER — Encounter: Payer: Self-pay | Admitting: Cardiology

## 2014-10-14 ENCOUNTER — Other Ambulatory Visit: Payer: Self-pay

## 2015-03-17 DIAGNOSIS — K921 Melena: Secondary | ICD-10-CM | POA: Diagnosis not present

## 2015-03-17 DIAGNOSIS — Z6821 Body mass index (BMI) 21.0-21.9, adult: Secondary | ICD-10-CM | POA: Diagnosis not present

## 2015-03-17 DIAGNOSIS — R109 Unspecified abdominal pain: Secondary | ICD-10-CM | POA: Diagnosis not present

## 2015-04-03 DIAGNOSIS — M79606 Pain in leg, unspecified: Secondary | ICD-10-CM | POA: Diagnosis not present

## 2015-04-03 DIAGNOSIS — R252 Cramp and spasm: Secondary | ICD-10-CM | POA: Diagnosis not present

## 2015-04-03 DIAGNOSIS — G40219 Localization-related (focal) (partial) symptomatic epilepsy and epileptic syndromes with complex partial seizures, intractable, without status epilepticus: Secondary | ICD-10-CM | POA: Diagnosis not present

## 2015-04-03 DIAGNOSIS — M542 Cervicalgia: Secondary | ICD-10-CM | POA: Diagnosis not present

## 2015-04-12 ENCOUNTER — Encounter (INDEPENDENT_AMBULATORY_CARE_PROVIDER_SITE_OTHER): Payer: Self-pay | Admitting: *Deleted

## 2015-05-01 ENCOUNTER — Telehealth (INDEPENDENT_AMBULATORY_CARE_PROVIDER_SITE_OTHER): Payer: Self-pay | Admitting: *Deleted

## 2015-05-01 ENCOUNTER — Encounter (INDEPENDENT_AMBULATORY_CARE_PROVIDER_SITE_OTHER): Payer: Self-pay | Admitting: Internal Medicine

## 2015-05-01 ENCOUNTER — Other Ambulatory Visit (INDEPENDENT_AMBULATORY_CARE_PROVIDER_SITE_OTHER): Payer: Self-pay | Admitting: *Deleted

## 2015-05-01 ENCOUNTER — Ambulatory Visit (INDEPENDENT_AMBULATORY_CARE_PROVIDER_SITE_OTHER): Payer: Medicare Other | Admitting: Internal Medicine

## 2015-05-01 VITALS — BP 102/60 | HR 66 | Temp 98.2°F | Ht 66.0 in | Wt 137.9 lb

## 2015-05-01 DIAGNOSIS — R195 Other fecal abnormalities: Secondary | ICD-10-CM | POA: Diagnosis not present

## 2015-05-01 DIAGNOSIS — Z1211 Encounter for screening for malignant neoplasm of colon: Secondary | ICD-10-CM

## 2015-05-01 NOTE — Telephone Encounter (Signed)
Patient needs trilyte 

## 2015-05-01 NOTE — Patient Instructions (Signed)
Colonoscopy.  The risks and benefits such as perforation, bleeding, and infection were reviewed with the patient and is agreeable. 

## 2015-05-01 NOTE — Progress Notes (Signed)
   Subjective:    Patient ID: Erin Cooper, female    DOB: 08-Jan-1951, 64 y.o.   MRN: [id number]  HPI Referred to our office by Dr. Hilma Favors [facility]) for blood in stool (positive stool card x 2).  Patient denies seeing any blood in her stools. Her BMs are brown in color. No melena or BRRB . Her appetite is good.  She has lost from 142 to 137.9. She occasionally has acid reflux. She occasionally has some upper abdominal pain but not often.  She has never undergone a colonoscopy in the past   Review of Systems     Past Medical History  Diagnosis Date  . Essential hypertension, benign   . COPD (chronic obstructive pulmonary disease)   . Coronary atherosclerosis of native coronary artery     Possible diagnosis - details not complete  . History of uterine cancer     Age 31  . Chronic neck pain   . Chronic back pain   . History of MRSA infection     Past Surgical History  Procedure Laterality Date  . Back surgery      x2  . Neck surgery      x2  . Cholecystectomy  2001  . Partial hysterectomy      Age 72, uterine cancer  . Appendectomy  2000  . Thyroid surgery    . Tumor excision      Chest  . Thymectomy      11/06/2009, Dr Servando Snare     Allergies  Allergen Reactions  . Morphine   . Penicillins     Current Outpatient Prescriptions on File Prior to Visit  Medication Sig Dispense Refill  . albuterol (PROVENTIL) (2.5 MG/3ML) 0.083% nebulizer solution Take 2.5 mg by nebulization as needed.      Marland Kitchen amitriptyline (ELAVIL) 50 MG tablet Take 50 mg by mouth at bedtime.      Marland Kitchen amLODipine (NORVASC) 5 MG tablet Take 2.5 mg by mouth daily.     Marland Kitchen losartan (COZAAR) 100 MG tablet Take 100 mg by mouth daily.     Marland Kitchen oxcarbazepine (TRILEPTAL) 600 MG tablet Take 300 mg by mouth 2 (two) times daily.     Marland Kitchen SYNTHROID 88 MCG tablet Take 88 mcg by mouth daily before breakfast.      No current facility-administered medications on file prior to visit.   Widowed. Works at [employer].  Objective:   Physical Exam Blood pressure 102/60, pulse 66, temperature 98.2 F (36.8 C), height 5\' 6"  (1.676 m), weight 137 lb 14.4 oz (62.551 kg).  Alert and oriented. Skin warm and dry. Oral mucosa is moist.   . Sclera anicteric, conjunctivae is pink. Thyroid not enlarged. No cervical lymphadenopathy. Lungs clear. Heart regular rate and rhythm.  Abdomen is soft. Bowel sounds are positive. No hepatomegaly. No abdominal masses felt. No tenderness.  No edema to lower extremities.     Developer: [id number], Exp 9-17  Card: Lot 02-14, Exp 07.18    Assessment & Plan:  Guaiac + stool.  Colonic neoplasm needs to be ruled out.  Colonoscopy. The risks and benefits such as perforation, bleeding, and infection were reviewed with the patient and is agreeable.

## 2015-05-10 MED ORDER — PEG 3350-KCL-NA BICARB-NACL 420 G PO SOLR: 4000.0000 mL | Freq: Once | ORAL | Status: DC

## 2015-05-12 ENCOUNTER — Ambulatory Visit (HOSPITAL_COMMUNITY)
Admission: RE | Admit: 2015-05-12 | Discharge: 2015-05-12 | Disposition: A | Discharge status: Home / Self Care | Payer: Medicare Other | Source: Ambulatory Visit | Attending: Internal Medicine | Admitting: Internal Medicine

## 2015-05-12 ENCOUNTER — Encounter (HOSPITAL_COMMUNITY): Payer: Self-pay

## 2015-05-12 ENCOUNTER — Encounter (HOSPITAL_COMMUNITY)
Admission: RE | Disposition: A | Discharge status: Home / Self Care | Payer: Self-pay | Source: Ambulatory Visit | Attending: Internal Medicine

## 2015-05-12 DIAGNOSIS — R195 Other fecal abnormalities: Secondary | ICD-10-CM

## 2015-05-12 DIAGNOSIS — I2510 Atherosclerotic heart disease of native coronary artery without angina pectoris: Secondary | ICD-10-CM | POA: Diagnosis not present

## 2015-05-12 DIAGNOSIS — Z886 Allergy status to analgesic agent status: Secondary | ICD-10-CM | POA: Diagnosis not present

## 2015-05-12 DIAGNOSIS — Z8542 Personal history of malignant neoplasm of other parts of uterus: Secondary | ICD-10-CM | POA: Insufficient documentation

## 2015-05-12 DIAGNOSIS — R569 Unspecified convulsions: Secondary | ICD-10-CM | POA: Diagnosis not present

## 2015-05-12 DIAGNOSIS — K649 Unspecified hemorrhoids: Secondary | ICD-10-CM | POA: Diagnosis not present

## 2015-05-12 DIAGNOSIS — J449 Chronic obstructive pulmonary disease, unspecified: Secondary | ICD-10-CM | POA: Insufficient documentation

## 2015-05-12 DIAGNOSIS — F17210 Nicotine dependence, cigarettes, uncomplicated: Secondary | ICD-10-CM | POA: Insufficient documentation

## 2015-05-12 DIAGNOSIS — Z90710 Acquired absence of both cervix and uterus: Secondary | ICD-10-CM | POA: Diagnosis not present

## 2015-05-12 DIAGNOSIS — K6289 Other specified diseases of anus and rectum: Secondary | ICD-10-CM | POA: Diagnosis not present

## 2015-05-12 DIAGNOSIS — Z880 Allergy status to penicillin: Secondary | ICD-10-CM | POA: Diagnosis not present

## 2015-05-12 DIAGNOSIS — Z9049 Acquired absence of other specified parts of digestive tract: Secondary | ICD-10-CM | POA: Insufficient documentation

## 2015-05-12 DIAGNOSIS — I10 Essential (primary) hypertension: Secondary | ICD-10-CM | POA: Diagnosis not present

## 2015-05-12 DIAGNOSIS — K921 Melena: Secondary | ICD-10-CM | POA: Diagnosis present

## 2015-05-12 DIAGNOSIS — K648 Other hemorrhoids: Secondary | ICD-10-CM | POA: Diagnosis not present

## 2015-05-12 HISTORY — PX: COLONOSCOPY: SHX5424

## 2015-05-12 SURGERY — COLONOSCOPY
Anesthesia: Moderate Sedation

## 2015-05-12 MED ORDER — MIDAZOLAM HCL 5 MG/5ML IJ SOLN
INTRAMUSCULAR | Status: AC
  Filled 2015-05-12: qty 10

## 2015-05-12 MED ORDER — STERILE WATER FOR IRRIGATION IR SOLN
Status: DC | PRN
  Administered 2015-05-12: 10:00:00

## 2015-05-12 MED ORDER — SODIUM CHLORIDE 0.9 % IV SOLN
INTRAVENOUS | Status: DC
  Administered 2015-05-12: 10:00:00 via INTRAVENOUS

## 2015-05-12 MED ORDER — MEPERIDINE HCL 50 MG/ML IJ SOLN
INTRAMUSCULAR | Status: AC
  Filled 2015-05-12: qty 1

## 2015-05-12 MED ORDER — MIDAZOLAM HCL 5 MG/5ML IJ SOLN
INTRAMUSCULAR | Status: DC | PRN
  Administered 2015-05-12 (×2): 1 mg via INTRAVENOUS
  Administered 2015-05-12: 2 mg via INTRAVENOUS
  Administered 2015-05-12 (×2): 1 mg via INTRAVENOUS

## 2015-05-12 MED ORDER — MEPERIDINE HCL 50 MG/ML IJ SOLN
INTRAMUSCULAR | Status: DC | PRN
  Administered 2015-05-12 (×2): 25 mg via INTRAVENOUS

## 2015-05-12 NOTE — H&P (Signed)
Erin Cooper is an 64 y.o. female.   Chief Complaint: Patient is  here for colonoscopy. HPI: She is 64 year old Caucasian female who is here for diagnostic colonoscopy. She had routine stool testing and to Hemoccults are positive. She denies melena or rectal bleeding abdominal pain. She also denies frequent heartburn dysphagia nausea or vomiting. Sr. appetite is normal. She's never been a big eater. She states she used to be constipated but since she is changing her eating habits her bowels are moving more or less regularly. She's never been screened for CRC. Family history is negative for CRC.  Past Medical History  Diagnosis Date  . Essential hypertension, benign   . COPD (chronic obstructive pulmonary disease)   . Coronary atherosclerosis of native coronary artery     Possible diagnosis - details not complete  . History of uterine cancer     Age 86  . Chronic neck pain   . Chronic back pain   . History of MRSA infection   . Seizures     Past Surgical History  Procedure Laterality Date  . Back surgery      x2  . Neck surgery      x2  . Cholecystectomy  2001  . Partial hysterectomy      Age 68, uterine cancer  . Appendectomy  2000  . Thyroid surgery    . Tumor excision      Chest  . Thymectomy      11/06/2009, Dr Servando Snare     Family History  Problem Relation Age of Onset  . Lung cancer Mother   . Heart failure Father   . Cirrhosis Father    Social History:  reports that she has been smoking Cigarettes.  She has been smoking about 0.50 packs per day. She does not have any smokeless tobacco history on file. She reports that she does not drink alcohol or use illicit drugs.  Allergies:  Allergies  Allergen Reactions  . Morphine   . Penicillins     Medications Prior to Admission  Medication Sig Dispense Refill  . albuterol (PROVENTIL) (2.5 MG/3ML) 0.083% nebulizer solution Take 2.5 mg by nebulization as needed.      Marland Kitchen amitriptyline (ELAVIL) 50 MG tablet Take 50 mg by  mouth at bedtime.      Marland Kitchen amLODipine (NORVASC) 5 MG tablet Take 2.5 mg by mouth daily.     Marland Kitchen ibuprofen (ADVIL,MOTRIN) 200 MG tablet Take 200 mg by mouth every 6 (six) hours as needed.    Marland Kitchen losartan (COZAAR) 100 MG tablet Take 100 mg by mouth daily.     Marland Kitchen oxcarbazepine (TRILEPTAL) 600 MG tablet Take 300 mg by mouth 2 (two) times daily.     . polyethylene glycol-electrolytes (NULYTELY/GOLYTELY) 420 G solution Take 4,000 mLs by mouth once. 4000 mL 0  . SYNTHROID 88 MCG tablet Take 88 mcg by mouth daily before breakfast.       No results found for this or any previous visit (from the past 48 hour(s)). No results found.  ROS  Blood pressure 133/74, pulse 67, temperature 98 F (36.7 C), temperature source Oral, resp. rate 15, SpO2 96 %. Physical Exam  Constitutional:  Well-developed thin Caucasian female in NAD.  HENT:  Mouth/Throat: Oropharynx is clear and moist.  Eyes: Conjunctivae are normal. No scleral icterus.  Neck: No thyromegaly present.  Cardiovascular: Normal rate, regular rhythm and normal heart sounds.   No murmur heard. Respiratory: Effort normal and breath sounds normal.  Asymmetric chest  with slight expansion the right side. Midsternal scar.  GI: Soft. She exhibits no distension. Tenderness: mild tenderness at RLQ.  No megaly or masses.  Musculoskeletal: She exhibits no edema.  Lymphadenopathy:    She has no cervical adenopathy.  Neurological: She is alert.  Skin: Skin is warm and dry.     Assessment/Plan Heme-positive stool. Diagnostic colonoscopy.  Erin Cooper U 05/12/2015, 11:01 AM

## 2015-05-12 NOTE — Discharge Instructions (Signed)
Resume usual medications and diet.  No driving for 24 hours.  CBC and Hemoccult by Dr. Hilma Favors in three months.  Call if you have tarry stools or rectal bleeding.  Colonoscopy, Care After Refer to this sheet in the next few weeks. These instructions provide you with information on caring for yourself after your procedure. Your health care provider may also give you more specific instructions. Your treatment has been planned according to current medical practices, but problems sometimes occur. Call your health care provider if you have any problems or questions after your procedure. WHAT TO EXPECT AFTER THE PROCEDURE  After your procedure, it is typical to have the following:  A small amount of blood in your stool.  Moderate amounts of gas and mild abdominal cramping or bloating. HOME CARE INSTRUCTIONS  Do not drive, operate machinery, or sign important documents for 24 hours.  You may shower and resume your regular physical activities, but move at a slower pace for the first 24 hours.  Take frequent rest periods for the first 24 hours.  Walk around or put a warm pack on your abdomen to help reduce abdominal cramping and bloating.  Drink enough fluids to keep your urine clear or pale yellow.  You may resume your normal diet as instructed by your health care provider. Avoid heavy or fried foods that are hard to digest.  Avoid drinking alcohol for 24 hours or as instructed by your health care provider.  Only take over-the-counter or prescription medicines as directed by your health care provider.  If a tissue sample (biopsy) was taken during your procedure:  Do not take aspirin or blood thinners for 7 days, or as instructed by your health care provider.  Do not drink alcohol for 7 days, or as instructed by your health care provider.  Eat soft foods for the first 24 hours. SEEK MEDICAL CARE IF: You have persistent spotting of blood in your stool 2-3 days after the procedure. SEEK  IMMEDIATE MEDICAL CARE IF:  You have more than a small spotting of blood in your stool.  You pass large blood clots in your stool.  Your abdomen is swollen (distended).  You have nausea or vomiting.  You have a fever.  You have increasing abdominal pain that is not relieved with medicine. Document Released: 07/30/2004 Document Revised: 10/06/2013 Document Reviewed: 08/23/2013 [facility] Patient Information 2015 [place], [place]. This information is not intended to replace advice given to you by your health care provider. Make sure you discuss any questions you have with your health care provider.

## 2015-05-12 NOTE — Op Note (Signed)
COLONOSCOPY PROCEDURE REPORT  PATIENT:  Erin Cooper  MR#:  [id number] Birthdate:  07-12-1951, 64 y.o., female Endoscopist:  Dr. Rogene Houston, MD Referred By:  Dr.  Purvis Kilts, MD Procedure Date: 05/12/2015  Procedure:   Colonoscopy  Indications:  { Patient is 52 old Caucasian female who was noted to have heme-positive stool on routine testing. She denies melena or rectal bleeding. Her H&H on 03/17/2015 was 13.1 and 38.5.  Patient has never been screened for CRC.  Patient takes OTC Advil no more than few times a month for back pain. Family history is negative for CRC.   Informed Consent:  The procedure and risks were reviewed with the patient and informed consent was obtained.  Medications:  Demerol 50 mg IV Versed 6 mg IV  Description of procedure:  After a digital rectal exam was performed, that colonoscope was advanced from the anus through the rectum and colon to the area of the cecum, ileocecal valve and appendiceal orifice. The cecum was deeply intubated. These structures were well-seen and photographed for the record. From the level of the cecum and ileocecal valve, the scope was slowly and cautiously withdrawn. The mucosal surfaces were carefully surveyed utilizing scope tip to flexion to facilitate fold flattening as needed. The scope was pulled down into the rectum where a thorough exam including retroflexion was performed.  Findings:  Prep excellent.  normal mucosa of cecum, ascending colon, hepatic flexure, transverse colon, splenic flexure, descending and sigmoid colon.  Normal rectal mucosa.  Small hemorrhoids below the dentate line and 2 anal papillae.      Therapeutic/Diagnostic Maneuvers Performed:   None  Complications:   none  Cecal Withdrawal Time:   8 minutes  Impression:   Normal colonoscopy except small external hemorrhoids and 2 anal papillae.  Recommendations:  Standard instructions given. Patient advised to call if she has rectal bleeding  or melena. She'll have CBC and Hemoccults by Dr. Hilma Favors in three months.  REHMAN,NAJEEB U  05/12/2015 11:36 AM  CC: Dr. Hilma Favors, Betsy Coder, MD & Dr. Rayne Du ref. provider found

## 2015-05-15 ENCOUNTER — Encounter (HOSPITAL_COMMUNITY): Payer: Self-pay | Admitting: Internal Medicine

## 2015-06-26 ENCOUNTER — Other Ambulatory Visit: Payer: Self-pay

## 2015-10-09 DIAGNOSIS — G40219 Localization-related (focal) (partial) symptomatic epilepsy and epileptic syndromes with complex partial seizures, intractable, without status epilepticus: Secondary | ICD-10-CM | POA: Diagnosis not present

## 2015-10-09 DIAGNOSIS — Z79899 Other long term (current) drug therapy: Secondary | ICD-10-CM | POA: Diagnosis not present

## 2015-10-09 DIAGNOSIS — M79606 Pain in leg, unspecified: Secondary | ICD-10-CM | POA: Diagnosis not present

## 2015-10-09 DIAGNOSIS — M542 Cervicalgia: Secondary | ICD-10-CM | POA: Diagnosis not present

## 2015-10-18 DIAGNOSIS — M5030 Other cervical disc degeneration, unspecified cervical region: Secondary | ICD-10-CM | POA: Diagnosis not present

## 2015-10-18 DIAGNOSIS — Z1389 Encounter for screening for other disorder: Secondary | ICD-10-CM | POA: Diagnosis not present

## 2015-10-18 DIAGNOSIS — I10 Essential (primary) hypertension: Secondary | ICD-10-CM | POA: Diagnosis not present

## 2015-10-18 DIAGNOSIS — Z6823 Body mass index (BMI) 23.0-23.9, adult: Secondary | ICD-10-CM | POA: Diagnosis not present

## 2015-10-18 DIAGNOSIS — G40309 Generalized idiopathic epilepsy and epileptic syndromes, not intractable, without status epilepticus: Secondary | ICD-10-CM | POA: Diagnosis not present

## 2016-04-04 DIAGNOSIS — G2581 Restless legs syndrome: Secondary | ICD-10-CM | POA: Diagnosis not present

## 2016-04-04 DIAGNOSIS — G40219 Localization-related (focal) (partial) symptomatic epilepsy and epileptic syndromes with complex partial seizures, intractable, without status epilepticus: Secondary | ICD-10-CM | POA: Diagnosis not present

## 2016-04-04 DIAGNOSIS — M542 Cervicalgia: Secondary | ICD-10-CM | POA: Diagnosis not present

## 2016-05-06 ENCOUNTER — Other Ambulatory Visit (HOSPITAL_COMMUNITY): Payer: Self-pay | Admitting: Family Medicine

## 2016-05-06 DIAGNOSIS — Z1231 Encounter for screening mammogram for malignant neoplasm of breast: Secondary | ICD-10-CM

## 2016-05-09 ENCOUNTER — Ambulatory Visit (HOSPITAL_COMMUNITY)
Admission: RE | Admit: 2016-05-09 | Discharge: 2016-05-09 | Disposition: A | Discharge status: Home / Self Care | Payer: Medicare Other | Source: Ambulatory Visit | Attending: Family Medicine | Admitting: Family Medicine

## 2016-05-09 ENCOUNTER — Ambulatory Visit (HOSPITAL_COMMUNITY): Payer: Medicare Other

## 2016-05-09 DIAGNOSIS — Z1231 Encounter for screening mammogram for malignant neoplasm of breast: Secondary | ICD-10-CM | POA: Diagnosis not present

## 2016-08-12 DIAGNOSIS — Z6824 Body mass index (BMI) 24.0-24.9, adult: Secondary | ICD-10-CM | POA: Diagnosis not present

## 2016-08-12 DIAGNOSIS — E039 Hypothyroidism, unspecified: Secondary | ICD-10-CM | POA: Diagnosis not present

## 2016-08-12 DIAGNOSIS — Z1389 Encounter for screening for other disorder: Secondary | ICD-10-CM | POA: Diagnosis not present

## 2016-08-12 DIAGNOSIS — Z0001 Encounter for general adult medical examination with abnormal findings: Secondary | ICD-10-CM | POA: Diagnosis not present

## 2016-08-20 DIAGNOSIS — Z0001 Encounter for general adult medical examination with abnormal findings: Secondary | ICD-10-CM | POA: Diagnosis not present

## 2016-08-20 DIAGNOSIS — M62838 Other muscle spasm: Secondary | ICD-10-CM | POA: Diagnosis not present

## 2016-08-20 DIAGNOSIS — Z1389 Encounter for screening for other disorder: Secondary | ICD-10-CM | POA: Diagnosis not present

## 2016-10-03 DIAGNOSIS — F5109 Other insomnia not due to a substance or known physiological condition: Secondary | ICD-10-CM | POA: Diagnosis not present

## 2016-10-03 DIAGNOSIS — M542 Cervicalgia: Secondary | ICD-10-CM | POA: Diagnosis not present

## 2017-03-31 DIAGNOSIS — M542 Cervicalgia: Secondary | ICD-10-CM | POA: Diagnosis not present

## 2017-03-31 DIAGNOSIS — M545 Low back pain: Secondary | ICD-10-CM | POA: Diagnosis not present

## 2017-03-31 DIAGNOSIS — G2581 Restless legs syndrome: Secondary | ICD-10-CM | POA: Diagnosis not present

## 2017-03-31 DIAGNOSIS — G4459 Other complicated headache syndrome: Secondary | ICD-10-CM | POA: Diagnosis not present

## 2017-04-15 DIAGNOSIS — Z6824 Body mass index (BMI) 24.0-24.9, adult: Secondary | ICD-10-CM | POA: Diagnosis not present

## 2017-04-15 DIAGNOSIS — J069 Acute upper respiratory infection, unspecified: Secondary | ICD-10-CM | POA: Diagnosis not present

## 2017-04-15 DIAGNOSIS — Z1389 Encounter for screening for other disorder: Secondary | ICD-10-CM | POA: Diagnosis not present

## 2017-04-15 DIAGNOSIS — R042 Hemoptysis: Secondary | ICD-10-CM | POA: Diagnosis not present

## 2017-04-15 DIAGNOSIS — M549 Dorsalgia, unspecified: Secondary | ICD-10-CM | POA: Diagnosis not present

## 2017-04-15 DIAGNOSIS — J209 Acute bronchitis, unspecified: Secondary | ICD-10-CM | POA: Diagnosis not present

## 2017-04-16 ENCOUNTER — Other Ambulatory Visit (HOSPITAL_COMMUNITY): Payer: Self-pay | Admitting: Family Medicine

## 2017-04-16 DIAGNOSIS — R042 Hemoptysis: Secondary | ICD-10-CM

## 2017-04-23 ENCOUNTER — Ambulatory Visit (HOSPITAL_COMMUNITY)
Admission: RE | Admit: 2017-04-23 | Discharge: 2017-04-23 | Disposition: A | Discharge status: Home / Self Care | Payer: Medicare Other | Source: Ambulatory Visit | Attending: Family Medicine | Admitting: Family Medicine

## 2017-04-23 DIAGNOSIS — J439 Emphysema, unspecified: Secondary | ICD-10-CM | POA: Diagnosis not present

## 2017-04-23 DIAGNOSIS — R918 Other nonspecific abnormal finding of lung field: Secondary | ICD-10-CM | POA: Insufficient documentation

## 2017-04-23 DIAGNOSIS — F17200 Nicotine dependence, unspecified, uncomplicated: Secondary | ICD-10-CM | POA: Insufficient documentation

## 2017-04-23 DIAGNOSIS — I2510 Atherosclerotic heart disease of native coronary artery without angina pectoris: Secondary | ICD-10-CM | POA: Insufficient documentation

## 2017-04-23 DIAGNOSIS — R042 Hemoptysis: Secondary | ICD-10-CM

## 2017-04-23 DIAGNOSIS — I700 Atherosclerosis of aorta: Secondary | ICD-10-CM | POA: Insufficient documentation

## 2017-04-23 LAB — POCT I-STAT CREATININE: Creatinine, Ser: 0.7 mg/dL (ref 0.44–1.00)

## 2017-04-23 MED ORDER — IOPAMIDOL (ISOVUE-300) INJECTION 61%
75.0000 mL | Freq: Once | INTRAVENOUS | Status: AC | PRN
  Administered 2017-04-23: 75 mL via INTRAVENOUS

## 2017-07-31 DIAGNOSIS — M542 Cervicalgia: Secondary | ICD-10-CM | POA: Diagnosis not present

## 2017-07-31 DIAGNOSIS — M545 Low back pain: Secondary | ICD-10-CM | POA: Diagnosis not present

## 2017-07-31 DIAGNOSIS — G4459 Other complicated headache syndrome: Secondary | ICD-10-CM | POA: Diagnosis not present

## 2017-07-31 DIAGNOSIS — G2581 Restless legs syndrome: Secondary | ICD-10-CM | POA: Diagnosis not present

## 2017-09-08 DIAGNOSIS — J449 Chronic obstructive pulmonary disease, unspecified: Secondary | ICD-10-CM | POA: Diagnosis not present

## 2017-09-08 DIAGNOSIS — E782 Mixed hyperlipidemia: Secondary | ICD-10-CM | POA: Diagnosis not present

## 2017-09-08 DIAGNOSIS — E039 Hypothyroidism, unspecified: Secondary | ICD-10-CM | POA: Diagnosis not present

## 2017-09-08 DIAGNOSIS — R918 Other nonspecific abnormal finding of lung field: Secondary | ICD-10-CM | POA: Diagnosis not present

## 2017-09-08 DIAGNOSIS — Z1389 Encounter for screening for other disorder: Secondary | ICD-10-CM | POA: Diagnosis not present

## 2017-09-08 DIAGNOSIS — Z6823 Body mass index (BMI) 23.0-23.9, adult: Secondary | ICD-10-CM | POA: Diagnosis not present

## 2017-09-15 DIAGNOSIS — E871 Hypo-osmolality and hyponatremia: Secondary | ICD-10-CM | POA: Diagnosis not present

## 2017-09-17 DIAGNOSIS — J439 Emphysema, unspecified: Secondary | ICD-10-CM | POA: Diagnosis not present

## 2017-09-17 DIAGNOSIS — I700 Atherosclerosis of aorta: Secondary | ICD-10-CM | POA: Diagnosis not present

## 2017-09-17 DIAGNOSIS — Z87898 Personal history of other specified conditions: Secondary | ICD-10-CM | POA: Diagnosis not present

## 2017-09-17 DIAGNOSIS — R918 Other nonspecific abnormal finding of lung field: Secondary | ICD-10-CM | POA: Diagnosis not present

## 2017-09-24 DIAGNOSIS — Z1231 Encounter for screening mammogram for malignant neoplasm of breast: Secondary | ICD-10-CM | POA: Diagnosis not present

## 2018-01-29 DIAGNOSIS — G40219 Localization-related (focal) (partial) symptomatic epilepsy and epileptic syndromes with complex partial seizures, intractable, without status epilepticus: Secondary | ICD-10-CM | POA: Diagnosis not present

## 2018-01-29 DIAGNOSIS — M542 Cervicalgia: Secondary | ICD-10-CM | POA: Diagnosis not present

## 2018-01-29 DIAGNOSIS — Z79899 Other long term (current) drug therapy: Secondary | ICD-10-CM | POA: Diagnosis not present

## 2018-01-29 DIAGNOSIS — M5412 Radiculopathy, cervical region: Secondary | ICD-10-CM | POA: Diagnosis not present

## 2018-02-09 DIAGNOSIS — H5200 Hypermetropia, unspecified eye: Secondary | ICD-10-CM | POA: Diagnosis not present

## 2018-04-21 DIAGNOSIS — H40003 Preglaucoma, unspecified, bilateral: Secondary | ICD-10-CM | POA: Diagnosis not present

## 2018-04-21 DIAGNOSIS — H25043 Posterior subcapsular polar age-related cataract, bilateral: Secondary | ICD-10-CM | POA: Diagnosis not present

## 2018-04-21 DIAGNOSIS — H25013 Cortical age-related cataract, bilateral: Secondary | ICD-10-CM | POA: Diagnosis not present

## 2018-04-21 DIAGNOSIS — H2512 Age-related nuclear cataract, left eye: Secondary | ICD-10-CM | POA: Diagnosis not present

## 2018-04-21 DIAGNOSIS — H18413 Arcus senilis, bilateral: Secondary | ICD-10-CM | POA: Diagnosis not present

## 2018-04-21 DIAGNOSIS — H2513 Age-related nuclear cataract, bilateral: Secondary | ICD-10-CM | POA: Diagnosis not present

## 2018-05-04 DIAGNOSIS — H2512 Age-related nuclear cataract, left eye: Secondary | ICD-10-CM | POA: Diagnosis not present

## 2018-05-05 DIAGNOSIS — Z961 Presence of intraocular lens: Secondary | ICD-10-CM | POA: Diagnosis not present

## 2018-05-05 DIAGNOSIS — H2511 Age-related nuclear cataract, right eye: Secondary | ICD-10-CM | POA: Diagnosis not present

## 2018-05-18 DIAGNOSIS — H2511 Age-related nuclear cataract, right eye: Secondary | ICD-10-CM | POA: Diagnosis not present

## 2018-07-01 DIAGNOSIS — H53483 Generalized contraction of visual field, bilateral: Secondary | ICD-10-CM | POA: Diagnosis not present

## 2018-07-01 DIAGNOSIS — H02423 Myogenic ptosis of bilateral eyelids: Secondary | ICD-10-CM | POA: Diagnosis not present

## 2018-07-01 DIAGNOSIS — H0279 Other degenerative disorders of eyelid and periocular area: Secondary | ICD-10-CM | POA: Diagnosis not present

## 2018-07-01 DIAGNOSIS — H02413 Mechanical ptosis of bilateral eyelids: Secondary | ICD-10-CM | POA: Diagnosis not present

## 2018-07-01 DIAGNOSIS — H02834 Dermatochalasis of left upper eyelid: Secondary | ICD-10-CM | POA: Diagnosis not present

## 2018-07-01 DIAGNOSIS — H57813 Brow ptosis, bilateral: Secondary | ICD-10-CM | POA: Diagnosis not present

## 2018-07-01 DIAGNOSIS — H02831 Dermatochalasis of right upper eyelid: Secondary | ICD-10-CM | POA: Diagnosis not present

## 2018-07-23 DIAGNOSIS — M542 Cervicalgia: Secondary | ICD-10-CM | POA: Diagnosis not present

## 2018-07-23 DIAGNOSIS — Z79899 Other long term (current) drug therapy: Secondary | ICD-10-CM | POA: Diagnosis not present

## 2018-07-23 DIAGNOSIS — M5412 Radiculopathy, cervical region: Secondary | ICD-10-CM | POA: Diagnosis not present

## 2018-07-23 DIAGNOSIS — G40219 Localization-related (focal) (partial) symptomatic epilepsy and epileptic syndromes with complex partial seizures, intractable, without status epilepticus: Secondary | ICD-10-CM | POA: Diagnosis not present

## 2018-07-31 ENCOUNTER — Other Ambulatory Visit (HOSPITAL_COMMUNITY): Payer: Self-pay | Admitting: Neurology

## 2018-07-31 ENCOUNTER — Ambulatory Visit (HOSPITAL_COMMUNITY)
Admission: RE | Admit: 2018-07-31 | Discharge: 2018-07-31 | Disposition: A | Discharge status: Home / Self Care | Payer: Medicare HMO | Source: Ambulatory Visit | Attending: Neurology | Admitting: Neurology

## 2018-07-31 DIAGNOSIS — M79671 Pain in right foot: Secondary | ICD-10-CM | POA: Insufficient documentation

## 2018-08-05 DIAGNOSIS — J449 Chronic obstructive pulmonary disease, unspecified: Secondary | ICD-10-CM | POA: Diagnosis not present

## 2018-08-05 DIAGNOSIS — Z0000 Encounter for general adult medical examination without abnormal findings: Secondary | ICD-10-CM | POA: Diagnosis not present

## 2018-08-05 DIAGNOSIS — Z1389 Encounter for screening for other disorder: Secondary | ICD-10-CM | POA: Diagnosis not present

## 2018-08-05 DIAGNOSIS — I10 Essential (primary) hypertension: Secondary | ICD-10-CM | POA: Diagnosis not present

## 2018-08-05 DIAGNOSIS — G40309 Generalized idiopathic epilepsy and epileptic syndromes, not intractable, without status epilepticus: Secondary | ICD-10-CM | POA: Diagnosis not present

## 2018-08-05 DIAGNOSIS — Z6824 Body mass index (BMI) 24.0-24.9, adult: Secondary | ICD-10-CM | POA: Diagnosis not present

## 2018-08-06 DIAGNOSIS — Z0000 Encounter for general adult medical examination without abnormal findings: Secondary | ICD-10-CM | POA: Diagnosis not present

## 2018-08-06 DIAGNOSIS — E039 Hypothyroidism, unspecified: Secondary | ICD-10-CM | POA: Diagnosis not present

## 2018-08-06 DIAGNOSIS — E782 Mixed hyperlipidemia: Secondary | ICD-10-CM | POA: Diagnosis not present

## 2018-08-06 DIAGNOSIS — R7309 Other abnormal glucose: Secondary | ICD-10-CM | POA: Diagnosis not present

## 2018-08-06 DIAGNOSIS — Z1389 Encounter for screening for other disorder: Secondary | ICD-10-CM | POA: Diagnosis not present

## 2018-08-06 DIAGNOSIS — R739 Hyperglycemia, unspecified: Secondary | ICD-10-CM | POA: Diagnosis not present

## 2018-08-06 DIAGNOSIS — Z6824 Body mass index (BMI) 24.0-24.9, adult: Secondary | ICD-10-CM | POA: Diagnosis not present

## 2018-08-10 DIAGNOSIS — H02834 Dermatochalasis of left upper eyelid: Secondary | ICD-10-CM | POA: Diagnosis not present

## 2018-08-10 DIAGNOSIS — H02831 Dermatochalasis of right upper eyelid: Secondary | ICD-10-CM | POA: Diagnosis not present

## 2018-08-10 DIAGNOSIS — H53453 Other localized visual field defect, bilateral: Secondary | ICD-10-CM | POA: Diagnosis not present

## 2018-08-20 DIAGNOSIS — Z09 Encounter for follow-up examination after completed treatment for conditions other than malignant neoplasm: Secondary | ICD-10-CM | POA: Diagnosis not present

## 2018-08-20 DIAGNOSIS — Z4802 Encounter for removal of sutures: Secondary | ICD-10-CM | POA: Diagnosis not present

## 2018-12-10 DIAGNOSIS — H16223 Keratoconjunctivitis sicca, not specified as Sjogren's, bilateral: Secondary | ICD-10-CM | POA: Diagnosis not present

## 2019-03-17 DIAGNOSIS — Z6824 Body mass index (BMI) 24.0-24.9, adult: Secondary | ICD-10-CM | POA: Diagnosis not present

## 2019-03-17 DIAGNOSIS — E039 Hypothyroidism, unspecified: Secondary | ICD-10-CM | POA: Diagnosis not present

## 2019-03-24 DIAGNOSIS — M542 Cervicalgia: Secondary | ICD-10-CM | POA: Diagnosis not present

## 2019-03-24 DIAGNOSIS — Z79899 Other long term (current) drug therapy: Secondary | ICD-10-CM | POA: Diagnosis not present

## 2019-03-24 DIAGNOSIS — G40219 Localization-related (focal) (partial) symptomatic epilepsy and epileptic syndromes with complex partial seizures, intractable, without status epilepticus: Secondary | ICD-10-CM | POA: Diagnosis not present

## 2019-03-24 DIAGNOSIS — M5412 Radiculopathy, cervical region: Secondary | ICD-10-CM | POA: Diagnosis not present

## 2019-06-17 DIAGNOSIS — E890 Postprocedural hypothyroidism: Secondary | ICD-10-CM | POA: Diagnosis not present

## 2019-08-16 DIAGNOSIS — Z1389 Encounter for screening for other disorder: Secondary | ICD-10-CM | POA: Diagnosis not present

## 2019-08-16 DIAGNOSIS — E7849 Other hyperlipidemia: Secondary | ICD-10-CM | POA: Diagnosis not present

## 2019-08-16 DIAGNOSIS — I10 Essential (primary) hypertension: Secondary | ICD-10-CM | POA: Diagnosis not present

## 2019-08-16 DIAGNOSIS — J449 Chronic obstructive pulmonary disease, unspecified: Secondary | ICD-10-CM | POA: Diagnosis not present

## 2019-08-16 DIAGNOSIS — Z6823 Body mass index (BMI) 23.0-23.9, adult: Secondary | ICD-10-CM | POA: Diagnosis not present

## 2019-08-16 DIAGNOSIS — Z0001 Encounter for general adult medical examination with abnormal findings: Secondary | ICD-10-CM | POA: Diagnosis not present

## 2019-08-16 DIAGNOSIS — I739 Peripheral vascular disease, unspecified: Secondary | ICD-10-CM | POA: Diagnosis not present

## 2019-08-16 DIAGNOSIS — Z719 Counseling, unspecified: Secondary | ICD-10-CM | POA: Diagnosis not present

## 2019-08-30 DIAGNOSIS — E782 Mixed hyperlipidemia: Secondary | ICD-10-CM | POA: Diagnosis not present

## 2019-08-30 DIAGNOSIS — I739 Peripheral vascular disease, unspecified: Secondary | ICD-10-CM | POA: Diagnosis not present

## 2019-08-30 DIAGNOSIS — I10 Essential (primary) hypertension: Secondary | ICD-10-CM | POA: Diagnosis not present

## 2019-09-01 ENCOUNTER — Encounter: Payer: Self-pay | Admitting: *Deleted

## 2019-09-02 ENCOUNTER — Encounter: Payer: Self-pay | Admitting: Cardiology

## 2019-09-02 ENCOUNTER — Other Ambulatory Visit: Payer: Self-pay

## 2019-09-02 ENCOUNTER — Ambulatory Visit: Payer: Medicare HMO | Admitting: Cardiology

## 2019-09-02 VITALS — BP 130/68 | HR 74 | Ht 63.0 in | Wt 141.6 lb

## 2019-09-02 DIAGNOSIS — M79605 Pain in left leg: Secondary | ICD-10-CM

## 2019-09-02 DIAGNOSIS — M79604 Pain in right leg: Secondary | ICD-10-CM

## 2019-09-02 DIAGNOSIS — I10 Essential (primary) hypertension: Secondary | ICD-10-CM | POA: Diagnosis not present

## 2019-09-02 DIAGNOSIS — I6523 Occlusion and stenosis of bilateral carotid arteries: Secondary | ICD-10-CM

## 2019-09-02 DIAGNOSIS — R0789 Other chest pain: Secondary | ICD-10-CM

## 2019-09-02 NOTE — Patient Instructions (Signed)
Your physician wants you to follow-up in: [place] will receive a reminder letter in the mail two months in advance. If you don't receive a letter, please call our office to schedule the follow-up appointment.  Your physician has recommended you make the following change in your medication:   START ASPIRIN 81 MG DAILY   Thank you for choosing [facility]!!

## 2019-09-02 NOTE — Progress Notes (Signed)
Clinical Summary Ms. Sanz is a 68 y.o.female last seen by Dr Domenic Polite in 2015. Seen today as a new patient, referred by pcp chest pain and leg pains.     1. CAD - 03/2017 CT chest: aortic and coronary calcifications - sharp pain midchest, can occur at anytime. 4/10 in severity. Lasts just a few seconds. No other asociated symptoms. Often occurs at night with laying down, can be positional. Pain started 4-5 years. Less frequent than before. - some DOE with house work, which somewhat new. - she reports remote history 20 years ago of a blockage in her heart. Unclear details at [facility]  - she had prior thymectomy in 2010 with scar. - she reports increased stress due to family members moving back home    2. PAD - tingling and numbness in toes, cramping at night - lifescreening ABI Left 0.95 Right 0.96  3. Hypertension - compliant with meds   Lifescreening  ABIs 0.95 left, right 0.96 AAA negative Carotid US: mild RICA, moderate LICA moderate  TC 322 HDL 76 LDL 119 TG 57   Past Medical History:  Diagnosis Date  . Chronic back pain   . Chronic neck pain   . COPD (chronic obstructive pulmonary disease) ([place])   . Coronary atherosclerosis of native coronary artery    Possible diagnosis - details not complete  . Essential hypertension, benign   . History of MRSA infection   . History of uterine cancer    Age 62  . Seizures (HCC)      Allergies  Allergen Reactions  . Morphine   . Penicillins      Current Outpatient Medications  Medication Sig Dispense Refill  . albuterol (PROVENTIL) (2.5 MG/3ML) 0.083% nebulizer solution Take 2.5 mg by nebulization as needed.      Marland Kitchen amitriptyline (ELAVIL) 50 MG tablet Take 50 mg by mouth at bedtime.      Marland Kitchen amLODipine (NORVASC) 5 MG tablet Take 2.5 mg by mouth daily.     Marland Kitchen ibuprofen (ADVIL,MOTRIN) 200 MG tablet Take 200 mg by mouth every 6 (six) hours as needed.    Marland Kitchen losartan (COZAAR) 100 MG tablet Take 100 mg by mouth daily.     Marland Kitchen  oxcarbazepine (TRILEPTAL) 600 MG tablet Take 300 mg by mouth 2 (two) times daily.     Marland Kitchen SYNTHROID 88 MCG tablet Take 88 mcg by mouth daily before breakfast.      No current facility-administered medications for this visit.      Past Surgical History:  Procedure Laterality Date  . APPENDECTOMY  2000  . BACK SURGERY     x2  . CHOLECYSTECTOMY  2001  . COLONOSCOPY N/A 05/12/2015   Procedure: COLONOSCOPY;  Surgeon: Rogene Houston, MD;  [facility];  Service: Endoscopy;  Laterality: N/A;  1220 - moved to 10:25 - Ann to notify pt  . NECK SURGERY     x2  . PARTIAL HYSTERECTOMY     Age 48, uterine cancer  . THYMECTOMY     11/06/2009, Dr Servando Snare   . THYROID SURGERY    . TUMOR EXCISION     Chest     Allergies  Allergen Reactions  . Morphine   . Penicillins       Family History  Problem Relation Age of Onset  . Lung cancer Mother   . Heart failure Father   . Cirrhosis Father      Social History Ms. Henigan reports that she has  been smoking cigarettes. She has been smoking about 0.50 packs per day. She does not have any smokeless tobacco history on file. Ms. Menor reports no history of alcohol use.   Review of Systems CONSTITUTIONAL: No weight loss, fever, chills, weakness or fatigue.  HEENT: Eyes: No visual loss, blurred vision, double vision or yellow sclerae.No hearing loss, sneezing, congestion, runny nose or sore throat.  SKIN: No rash or itching.  CARDIOVASCULAR: per hpi RESPIRATORY: No shortness of breath, cough or sputum.  GASTROINTESTINAL: No anorexia, nausea, vomiting or diarrhea. No abdominal pain or blood.  GENITOURINARY: No burning on urination, no polyuria NEUROLOGICAL: No headache, dizziness, syncope, paralysis, ataxia, numbness or tingling in the extremities. No change in bowel or bladder control.  MUSCULOSKELETAL: No muscle, back pain, joint pain or stiffness.  LYMPHATICS: No enlarged nodes. No history of splenectomy.  PSYCHIATRIC: No history  of depression or anxiety.  ENDOCRINOLOGIC: No reports of sweating, cold or heat intolerance. No polyuria or polydipsia.  Marland Kitchen   Physical Examination Today's Vitals   09/02/19 1333 09/02/19 1343  BP: 126/67 130/68  Pulse: 78 74  SpO2: 97% 98%  Weight: 141 lb 9.6 oz (64.2 kg)   Height: 5\' 3"  (1.6 m)    Body mass index is 25.08 kg/m.  Gen: resting comfortably, no acute distress HEENT: no scleral icterus, pupils equal round and reactive, no palptable cervical adenopathy,  CV: RRR, no m/r/g no jvd Resp: Clear to auscultation bilaterally GI: abdomen is soft, non-tender, non-distended, normal bowel sounds, no hepatosplenomegaly MSK: extremities are warm, no edema.  Skin: warm, no rash Neuro:  no focal deficits Psych: appropriate affect   Diagnostic Studies 10/2009 nuclear stress IMPRESSION: Overall normal exercise Myoview as outlined.  There were no diagnostic ST-segment changes noted at a maximum workload of seven METS.  No chest pain was reported.  There was a hypertensive response to exercise.  Perfusion imaging demonstrates breast attenuation artifact without evidence of ischemia, and overall normal ejection fraction of 68%.   Assessment and Plan  1. Chest pain/CAD - unclear history of possible CAD by cath 20 years ago - ongoing atypical chest pain which she has a prior history of  - description of current symptoms not consistent with ischemia - continue to monitor at this time, would start ASA 81mg  daily.  - I would recommend a statin due to reported CAD, also her ASCVD risk score is 19.5% which would support a statin, will defer to pcp who has been following her cholesterol.  - EKG today SR, no acute ischemic changes   2. Leg pains - symptoms not consistent with claudication - she had lifescreening 11/2019 with multiple tests including an ABI which was normal - no further PAD workup, likely neuropathic  3. HTN - at goal, continue current meds  4. Carotid stenosis  - noted on life screening exams she had 11/2019, mild to moderate    Previously seen by Dr Domenic Polite, will reestablish with him in 39 months   Arnoldo Lenis, M.D.,

## 2019-09-29 DIAGNOSIS — E7849 Other hyperlipidemia: Secondary | ICD-10-CM | POA: Diagnosis not present

## 2019-09-29 DIAGNOSIS — E039 Hypothyroidism, unspecified: Secondary | ICD-10-CM | POA: Diagnosis not present

## 2019-09-29 DIAGNOSIS — I10 Essential (primary) hypertension: Secondary | ICD-10-CM | POA: Diagnosis not present

## 2019-10-30 DIAGNOSIS — I10 Essential (primary) hypertension: Secondary | ICD-10-CM | POA: Diagnosis not present

## 2019-10-30 DIAGNOSIS — J449 Chronic obstructive pulmonary disease, unspecified: Secondary | ICD-10-CM | POA: Diagnosis not present

## 2019-11-29 DIAGNOSIS — J449 Chronic obstructive pulmonary disease, unspecified: Secondary | ICD-10-CM | POA: Diagnosis not present

## 2019-11-29 DIAGNOSIS — I10 Essential (primary) hypertension: Secondary | ICD-10-CM | POA: Diagnosis not present

## 2019-12-30 DIAGNOSIS — F17200 Nicotine dependence, unspecified, uncomplicated: Secondary | ICD-10-CM | POA: Diagnosis not present

## 2019-12-30 DIAGNOSIS — J449 Chronic obstructive pulmonary disease, unspecified: Secondary | ICD-10-CM | POA: Diagnosis not present

## 2019-12-30 DIAGNOSIS — I2510 Atherosclerotic heart disease of native coronary artery without angina pectoris: Secondary | ICD-10-CM | POA: Diagnosis not present

## 2019-12-30 DIAGNOSIS — E039 Hypothyroidism, unspecified: Secondary | ICD-10-CM | POA: Diagnosis not present

## 2020-01-30 DIAGNOSIS — I10 Essential (primary) hypertension: Secondary | ICD-10-CM | POA: Diagnosis not present

## 2020-01-30 DIAGNOSIS — E039 Hypothyroidism, unspecified: Secondary | ICD-10-CM | POA: Diagnosis not present

## 2020-01-30 DIAGNOSIS — Z720 Tobacco use: Secondary | ICD-10-CM | POA: Diagnosis not present

## 2020-01-30 DIAGNOSIS — J449 Chronic obstructive pulmonary disease, unspecified: Secondary | ICD-10-CM | POA: Diagnosis not present

## 2020-02-27 DIAGNOSIS — J449 Chronic obstructive pulmonary disease, unspecified: Secondary | ICD-10-CM | POA: Diagnosis not present

## 2020-02-27 DIAGNOSIS — I10 Essential (primary) hypertension: Secondary | ICD-10-CM | POA: Diagnosis not present

## 2020-02-27 DIAGNOSIS — E039 Hypothyroidism, unspecified: Secondary | ICD-10-CM | POA: Diagnosis not present

## 2020-02-27 DIAGNOSIS — Z720 Tobacco use: Secondary | ICD-10-CM | POA: Diagnosis not present

## 2020-03-16 ENCOUNTER — Other Ambulatory Visit: Payer: Self-pay

## 2020-03-16 ENCOUNTER — Ambulatory Visit: Payer: Medicare HMO | Attending: Internal Medicine

## 2020-03-16 DIAGNOSIS — Z20822 Contact with and (suspected) exposure to covid-19: Secondary | ICD-10-CM | POA: Diagnosis not present

## 2020-03-16 DIAGNOSIS — J441 Chronic obstructive pulmonary disease with (acute) exacerbation: Secondary | ICD-10-CM | POA: Diagnosis not present

## 2020-03-16 DIAGNOSIS — J019 Acute sinusitis, unspecified: Secondary | ICD-10-CM | POA: Diagnosis not present

## 2020-03-17 LAB — NOVEL CORONAVIRUS, NAA: SARS-CoV-2, NAA: NOT DETECTED

## 2020-03-29 DIAGNOSIS — Z720 Tobacco use: Secondary | ICD-10-CM | POA: Diagnosis not present

## 2020-03-29 DIAGNOSIS — I10 Essential (primary) hypertension: Secondary | ICD-10-CM | POA: Diagnosis not present

## 2020-03-29 DIAGNOSIS — J449 Chronic obstructive pulmonary disease, unspecified: Secondary | ICD-10-CM | POA: Diagnosis not present

## 2020-03-29 DIAGNOSIS — E039 Hypothyroidism, unspecified: Secondary | ICD-10-CM | POA: Diagnosis not present

## 2020-04-28 DIAGNOSIS — Z720 Tobacco use: Secondary | ICD-10-CM | POA: Diagnosis not present

## 2020-04-28 DIAGNOSIS — J449 Chronic obstructive pulmonary disease, unspecified: Secondary | ICD-10-CM | POA: Diagnosis not present

## 2020-04-28 DIAGNOSIS — I10 Essential (primary) hypertension: Secondary | ICD-10-CM | POA: Diagnosis not present

## 2020-04-28 DIAGNOSIS — E039 Hypothyroidism, unspecified: Secondary | ICD-10-CM | POA: Diagnosis not present

## 2020-05-22 DIAGNOSIS — F419 Anxiety disorder, unspecified: Secondary | ICD-10-CM | POA: Diagnosis not present

## 2020-05-22 DIAGNOSIS — Z8249 Family history of ischemic heart disease and other diseases of the circulatory system: Secondary | ICD-10-CM | POA: Diagnosis not present

## 2020-05-22 DIAGNOSIS — Z6823 Body mass index (BMI) 23.0-23.9, adult: Secondary | ICD-10-CM | POA: Diagnosis not present

## 2020-05-22 DIAGNOSIS — Z1389 Encounter for screening for other disorder: Secondary | ICD-10-CM | POA: Diagnosis not present

## 2020-05-29 DIAGNOSIS — J449 Chronic obstructive pulmonary disease, unspecified: Secondary | ICD-10-CM | POA: Diagnosis not present

## 2020-05-29 DIAGNOSIS — Z720 Tobacco use: Secondary | ICD-10-CM | POA: Diagnosis not present

## 2020-05-29 DIAGNOSIS — E039 Hypothyroidism, unspecified: Secondary | ICD-10-CM | POA: Diagnosis not present

## 2020-05-29 DIAGNOSIS — I10 Essential (primary) hypertension: Secondary | ICD-10-CM | POA: Diagnosis not present

## 2020-06-06 ENCOUNTER — Encounter (HOSPITAL_COMMUNITY): Payer: Self-pay | Admitting: Emergency Medicine

## 2020-06-06 ENCOUNTER — Emergency Department (HOSPITAL_COMMUNITY)
Admission: EM | Admit: 2020-06-06 | Discharge: 2020-06-06 | Disposition: A | Discharge status: Home / Self Care | Payer: Medicare HMO | Attending: Emergency Medicine | Admitting: Emergency Medicine

## 2020-06-06 ENCOUNTER — Other Ambulatory Visit: Payer: Self-pay

## 2020-06-06 DIAGNOSIS — I10 Essential (primary) hypertension: Secondary | ICD-10-CM | POA: Insufficient documentation

## 2020-06-06 DIAGNOSIS — Z5321 Procedure and treatment not carried out due to patient leaving prior to being seen by health care provider: Secondary | ICD-10-CM | POA: Insufficient documentation

## 2020-06-06 LAB — CBC
HCT: 40.8 % (ref 36.0–46.0)
Hemoglobin: 14 g/dL (ref 12.0–15.0)
MCH: 33.4 pg (ref 26.0–34.0)
MCHC: 34.3 g/dL (ref 30.0–36.0)
MCV: 97.4 fL (ref 80.0–100.0)
Platelets: 218 10*3/uL (ref 150–400)
RBC: 4.19 MIL/uL (ref 3.87–5.11)
RDW: 12.7 % (ref 11.5–15.5)
WBC: 5.7 10*3/uL (ref 4.0–10.5)
nRBC: 0 % (ref 0.0–0.2)

## 2020-06-06 LAB — BASIC METABOLIC PANEL
Anion gap: 11 (ref 5–15)
BUN: 8 mg/dL (ref 8–23)
CO2: 26 mmol/L (ref 22–32)
Calcium: 9.3 mg/dL (ref 8.9–10.3)
Chloride: 93 mmol/L — ABNORMAL LOW (ref 98–111)
Creatinine, Ser: 0.78 mg/dL (ref 0.44–1.00)
GFR calc Af Amer: >60 mL/min (ref >60)
GFR calc non Af Amer: >60 mL/min (ref >60)
Glucose, Bld: 104 mg/dL — ABNORMAL HIGH (ref 70–99)
Potassium: 4.5 mmol/L (ref 3.5–5.1)
Sodium: 130 mmol/L — ABNORMAL LOW (ref 135–145)

## 2020-06-06 NOTE — ED Triage Notes (Signed)
BP at home 205/105.  Pt reports BP has been up all weekend.  C/o eyes feeling tired.  BP in triage 150/115.  Denies headache or vision changes.Pt says she took BP medications this am.

## 2020-06-06 NOTE — ED Notes (Signed)
Called x 1 with no answer. Patient not in the waiting room

## 2020-06-07 DIAGNOSIS — I10 Essential (primary) hypertension: Secondary | ICD-10-CM | POA: Diagnosis not present

## 2020-06-28 DIAGNOSIS — E039 Hypothyroidism, unspecified: Secondary | ICD-10-CM | POA: Diagnosis not present

## 2020-06-28 DIAGNOSIS — J449 Chronic obstructive pulmonary disease, unspecified: Secondary | ICD-10-CM | POA: Diagnosis not present

## 2020-06-28 DIAGNOSIS — Z720 Tobacco use: Secondary | ICD-10-CM | POA: Diagnosis not present

## 2020-06-28 DIAGNOSIS — I10 Essential (primary) hypertension: Secondary | ICD-10-CM | POA: Diagnosis not present

## 2020-07-28 DIAGNOSIS — Z720 Tobacco use: Secondary | ICD-10-CM | POA: Diagnosis not present

## 2020-07-28 DIAGNOSIS — E039 Hypothyroidism, unspecified: Secondary | ICD-10-CM | POA: Diagnosis not present

## 2020-07-28 DIAGNOSIS — I10 Essential (primary) hypertension: Secondary | ICD-10-CM | POA: Diagnosis not present

## 2020-07-28 DIAGNOSIS — J449 Chronic obstructive pulmonary disease, unspecified: Secondary | ICD-10-CM | POA: Diagnosis not present

## 2020-08-18 ENCOUNTER — Other Ambulatory Visit (HOSPITAL_COMMUNITY): Payer: Self-pay | Admitting: Family Medicine

## 2020-08-18 DIAGNOSIS — Z6823 Body mass index (BMI) 23.0-23.9, adult: Secondary | ICD-10-CM | POA: Diagnosis not present

## 2020-08-18 DIAGNOSIS — E2839 Other primary ovarian failure: Secondary | ICD-10-CM

## 2020-08-18 DIAGNOSIS — I10 Essential (primary) hypertension: Secondary | ICD-10-CM | POA: Diagnosis not present

## 2020-08-18 DIAGNOSIS — J449 Chronic obstructive pulmonary disease, unspecified: Secondary | ICD-10-CM | POA: Diagnosis not present

## 2020-08-18 DIAGNOSIS — Z0001 Encounter for general adult medical examination with abnormal findings: Secondary | ICD-10-CM | POA: Diagnosis not present

## 2020-08-18 DIAGNOSIS — Z1389 Encounter for screening for other disorder: Secondary | ICD-10-CM | POA: Diagnosis not present

## 2020-08-18 DIAGNOSIS — E559 Vitamin D deficiency, unspecified: Secondary | ICD-10-CM | POA: Diagnosis not present

## 2020-08-18 DIAGNOSIS — E7849 Other hyperlipidemia: Secondary | ICD-10-CM | POA: Diagnosis not present

## 2020-08-18 DIAGNOSIS — Z0000 Encounter for general adult medical examination without abnormal findings: Secondary | ICD-10-CM | POA: Diagnosis not present

## 2020-08-18 DIAGNOSIS — I739 Peripheral vascular disease, unspecified: Secondary | ICD-10-CM | POA: Diagnosis not present

## 2020-08-18 DIAGNOSIS — Z1231 Encounter for screening mammogram for malignant neoplasm of breast: Secondary | ICD-10-CM

## 2020-08-29 DIAGNOSIS — E039 Hypothyroidism, unspecified: Secondary | ICD-10-CM | POA: Diagnosis not present

## 2020-08-29 DIAGNOSIS — I10 Essential (primary) hypertension: Secondary | ICD-10-CM | POA: Diagnosis not present

## 2020-08-29 DIAGNOSIS — J449 Chronic obstructive pulmonary disease, unspecified: Secondary | ICD-10-CM | POA: Diagnosis not present

## 2020-08-29 DIAGNOSIS — Z720 Tobacco use: Secondary | ICD-10-CM | POA: Diagnosis not present

## 2020-10-04 DIAGNOSIS — H16223 Keratoconjunctivitis sicca, not specified as Sjogren's, bilateral: Secondary | ICD-10-CM | POA: Diagnosis not present

## 2020-10-28 DIAGNOSIS — J449 Chronic obstructive pulmonary disease, unspecified: Secondary | ICD-10-CM | POA: Diagnosis not present

## 2020-10-28 DIAGNOSIS — I10 Essential (primary) hypertension: Secondary | ICD-10-CM | POA: Diagnosis not present

## 2020-10-28 DIAGNOSIS — Z720 Tobacco use: Secondary | ICD-10-CM | POA: Diagnosis not present

## 2020-10-28 DIAGNOSIS — E039 Hypothyroidism, unspecified: Secondary | ICD-10-CM | POA: Diagnosis not present

## 2020-11-28 DIAGNOSIS — E039 Hypothyroidism, unspecified: Secondary | ICD-10-CM | POA: Diagnosis not present

## 2020-11-28 DIAGNOSIS — Z720 Tobacco use: Secondary | ICD-10-CM | POA: Diagnosis not present

## 2020-11-28 DIAGNOSIS — I10 Essential (primary) hypertension: Secondary | ICD-10-CM | POA: Diagnosis not present

## 2020-11-28 DIAGNOSIS — J449 Chronic obstructive pulmonary disease, unspecified: Secondary | ICD-10-CM | POA: Diagnosis not present

## 2020-12-14 ENCOUNTER — Other Ambulatory Visit (HOSPITAL_COMMUNITY): Payer: Self-pay | Admitting: Family Medicine

## 2020-12-14 ENCOUNTER — Other Ambulatory Visit: Payer: Self-pay | Admitting: Family Medicine

## 2020-12-14 DIAGNOSIS — Z122 Encounter for screening for malignant neoplasm of respiratory organs: Secondary | ICD-10-CM

## 2020-12-14 DIAGNOSIS — F17200 Nicotine dependence, unspecified, uncomplicated: Secondary | ICD-10-CM

## 2020-12-18 DIAGNOSIS — H01004 Unspecified blepharitis left upper eyelid: Secondary | ICD-10-CM | POA: Diagnosis not present

## 2020-12-18 DIAGNOSIS — H01002 Unspecified blepharitis right lower eyelid: Secondary | ICD-10-CM | POA: Diagnosis not present

## 2020-12-18 DIAGNOSIS — H26493 Other secondary cataract, bilateral: Secondary | ICD-10-CM | POA: Diagnosis not present

## 2020-12-18 DIAGNOSIS — H02834 Dermatochalasis of left upper eyelid: Secondary | ICD-10-CM | POA: Diagnosis not present

## 2020-12-18 DIAGNOSIS — H01005 Unspecified blepharitis left lower eyelid: Secondary | ICD-10-CM | POA: Diagnosis not present

## 2020-12-18 DIAGNOSIS — Z961 Presence of intraocular lens: Secondary | ICD-10-CM | POA: Diagnosis not present

## 2020-12-18 DIAGNOSIS — H02831 Dermatochalasis of right upper eyelid: Secondary | ICD-10-CM | POA: Diagnosis not present

## 2020-12-18 DIAGNOSIS — H01001 Unspecified blepharitis right upper eyelid: Secondary | ICD-10-CM | POA: Diagnosis not present

## 2020-12-18 DIAGNOSIS — H524 Presbyopia: Secondary | ICD-10-CM | POA: Diagnosis not present

## 2021-01-27 DIAGNOSIS — Z720 Tobacco use: Secondary | ICD-10-CM | POA: Diagnosis not present

## 2021-01-27 DIAGNOSIS — E039 Hypothyroidism, unspecified: Secondary | ICD-10-CM | POA: Diagnosis not present

## 2021-01-27 DIAGNOSIS — J449 Chronic obstructive pulmonary disease, unspecified: Secondary | ICD-10-CM | POA: Diagnosis not present

## 2021-01-27 DIAGNOSIS — I10 Essential (primary) hypertension: Secondary | ICD-10-CM | POA: Diagnosis not present

## 2021-02-26 DIAGNOSIS — Z6824 Body mass index (BMI) 24.0-24.9, adult: Secondary | ICD-10-CM | POA: Diagnosis not present

## 2021-02-26 DIAGNOSIS — I10 Essential (primary) hypertension: Secondary | ICD-10-CM | POA: Diagnosis not present

## 2021-02-26 DIAGNOSIS — M25812 Other specified joint disorders, left shoulder: Secondary | ICD-10-CM | POA: Diagnosis not present

## 2021-02-26 DIAGNOSIS — E039 Hypothyroidism, unspecified: Secondary | ICD-10-CM | POA: Diagnosis not present

## 2021-02-26 DIAGNOSIS — J449 Chronic obstructive pulmonary disease, unspecified: Secondary | ICD-10-CM | POA: Diagnosis not present

## 2021-02-26 DIAGNOSIS — Z720 Tobacco use: Secondary | ICD-10-CM | POA: Diagnosis not present

## 2021-02-26 DIAGNOSIS — M7542 Impingement syndrome of left shoulder: Secondary | ICD-10-CM | POA: Diagnosis not present

## 2021-03-06 DIAGNOSIS — M25512 Pain in left shoulder: Secondary | ICD-10-CM | POA: Diagnosis not present

## 2021-03-06 DIAGNOSIS — I10 Essential (primary) hypertension: Secondary | ICD-10-CM | POA: Diagnosis not present

## 2021-03-16 ENCOUNTER — Emergency Department (HOSPITAL_COMMUNITY): Payer: Medicare HMO

## 2021-03-16 ENCOUNTER — Encounter (HOSPITAL_COMMUNITY): Payer: Self-pay

## 2021-03-16 ENCOUNTER — Emergency Department (HOSPITAL_COMMUNITY)
Admission: EM | Admit: 2021-03-16 | Discharge: 2021-03-16 | Disposition: A | Discharge status: Home / Self Care | Payer: Medicare HMO | Attending: Emergency Medicine | Admitting: Emergency Medicine

## 2021-03-16 ENCOUNTER — Other Ambulatory Visit: Payer: Self-pay

## 2021-03-16 DIAGNOSIS — I2699 Other pulmonary embolism without acute cor pulmonale: Secondary | ICD-10-CM | POA: Diagnosis not present

## 2021-03-16 DIAGNOSIS — M25512 Pain in left shoulder: Secondary | ICD-10-CM | POA: Diagnosis not present

## 2021-03-16 DIAGNOSIS — Z7982 Long term (current) use of aspirin: Secondary | ICD-10-CM | POA: Insufficient documentation

## 2021-03-16 DIAGNOSIS — J449 Chronic obstructive pulmonary disease, unspecified: Secondary | ICD-10-CM | POA: Diagnosis not present

## 2021-03-16 DIAGNOSIS — E039 Hypothyroidism, unspecified: Secondary | ICD-10-CM | POA: Insufficient documentation

## 2021-03-16 DIAGNOSIS — I10 Essential (primary) hypertension: Secondary | ICD-10-CM | POA: Insufficient documentation

## 2021-03-16 DIAGNOSIS — Z79899 Other long term (current) drug therapy: Secondary | ICD-10-CM | POA: Insufficient documentation

## 2021-03-16 DIAGNOSIS — G40309 Generalized idiopathic epilepsy and epileptic syndromes, not intractable, without status epilepticus: Secondary | ICD-10-CM | POA: Diagnosis not present

## 2021-03-16 DIAGNOSIS — R0602 Shortness of breath: Secondary | ICD-10-CM | POA: Insufficient documentation

## 2021-03-16 DIAGNOSIS — I2510 Atherosclerotic heart disease of native coronary artery without angina pectoris: Secondary | ICD-10-CM | POA: Diagnosis not present

## 2021-03-16 DIAGNOSIS — I739 Peripheral vascular disease, unspecified: Secondary | ICD-10-CM | POA: Diagnosis not present

## 2021-03-16 DIAGNOSIS — Z8542 Personal history of malignant neoplasm of other parts of uterus: Secondary | ICD-10-CM | POA: Diagnosis not present

## 2021-03-16 DIAGNOSIS — R0789 Other chest pain: Secondary | ICD-10-CM | POA: Insufficient documentation

## 2021-03-16 DIAGNOSIS — R079 Chest pain, unspecified: Secondary | ICD-10-CM | POA: Diagnosis not present

## 2021-03-16 DIAGNOSIS — Z6824 Body mass index (BMI) 24.0-24.9, adult: Secondary | ICD-10-CM | POA: Diagnosis not present

## 2021-03-16 DIAGNOSIS — F17210 Nicotine dependence, cigarettes, uncomplicated: Secondary | ICD-10-CM | POA: Insufficient documentation

## 2021-03-16 DIAGNOSIS — S46002D Unspecified injury of muscle(s) and tendon(s) of the rotator cuff of left shoulder, subsequent encounter: Secondary | ICD-10-CM | POA: Diagnosis not present

## 2021-03-16 DIAGNOSIS — F17200 Nicotine dependence, unspecified, uncomplicated: Secondary | ICD-10-CM | POA: Diagnosis not present

## 2021-03-16 LAB — BASIC METABOLIC PANEL
Anion gap: 9 (ref 5–15)
BUN: 15 mg/dL (ref 8–23)
CO2: 27 mmol/L (ref 22–32)
Calcium: 9.3 mg/dL (ref 8.9–10.3)
Chloride: 92 mmol/L — ABNORMAL LOW (ref 98–111)
Creatinine, Ser: 0.97 mg/dL (ref 0.44–1.00)
GFR, Estimated: >60 mL/min (ref >60)
Glucose, Bld: 96 mg/dL (ref 70–99)
Potassium: 3.9 mmol/L (ref 3.5–5.1)
Sodium: 128 mmol/L — ABNORMAL LOW (ref 135–145)

## 2021-03-16 LAB — CBC
HCT: 42.5 % (ref 36.0–46.0)
Hemoglobin: 14.4 g/dL (ref 12.0–15.0)
MCH: 34 pg (ref 26.0–34.0)
MCHC: 33.9 g/dL (ref 30.0–36.0)
MCV: 100.5 fL — ABNORMAL HIGH (ref 80.0–100.0)
Platelets: 267 10*3/uL (ref 150–400)
RBC: 4.23 MIL/uL (ref 3.87–5.11)
RDW: 13.2 % (ref 11.5–15.5)
WBC: 8.3 10*3/uL (ref 4.0–10.5)
nRBC: 0 % (ref 0.0–0.2)

## 2021-03-16 LAB — TROPONIN I (HIGH SENSITIVITY)
Troponin I (High Sensitivity): 4 ng/L (ref <18)
Troponin I (High Sensitivity): 4 ng/L (ref <18)

## 2021-03-16 LAB — D-DIMER, QUANTITATIVE: D-Dimer, Quant: 1.07 ug/mL-FEU — ABNORMAL HIGH (ref 0.00–0.50)

## 2021-03-16 MED ORDER — IOHEXOL 350 MG/ML SOLN
75.0000 mL | Freq: Once | INTRAVENOUS | Status: AC | PRN
  Administered 2021-03-16: 75 mL via INTRAVENOUS

## 2021-03-16 NOTE — ED Provider Notes (Signed)
[facility] EMERGENCY DEPARTMENT Provider Note   CSN: [id number] Arrival date & time: 03/16/21  7672     History Chief Complaint  Patient presents with  . Chest Pain    Erin Cooper is a 70 y.o. female.  HPI      Erin Cooper is a 70 y.o. female with past medical history of COPD, coronary atherosclerosis, hypertension, and seizures who presents to the Emergency Department complaining of left shoulder and left chest pain.  Symptoms have been present for 3 weeks, gradually worsening for several days.  No known injury.  She was seen by her primary care provider today and advised to come to the emergency department for further evaluation.  At onset of her symptoms, she was seen by provider at PCPs office and given an injection into her left shoulder without improvement.  She was also seen at an urgent care and had an x-ray of her left shoulder and started on Mobic which has also not provided relief.  He describes aching pain to her left shoulder that radiates to her left chest.  Pain has been associated with some shortness of breath.  Pain worsens with attempted movement of the left shoulder and arm.  She denies numbness or tingling of her extremities, neck pain and back pain.  No history of PE or DVT.  No known Covid exposures.  She has not been vaccinated for COVID.  Past Medical History:  Diagnosis Date  . Chronic back pain   . Chronic neck pain   . COPD (chronic obstructive pulmonary disease) ([place])   . Coronary atherosclerosis of native coronary artery    Possible diagnosis - details not complete  . Essential hypertension, benign   . History of MRSA infection   . History of uterine cancer    Age 67  . Seizures [facility])     Patient Active Problem List   Diagnosis Date Noted  . Syncope 04/20/2014  . HYPOTHYROIDISM 05/29/2010  . Essential hypertension, benign 10/31/2009  . CORONARY ATHEROSCLEROSIS NATIVE CORONARY ARTERY 10/31/2009    Past Surgical History:  Procedure  Laterality Date  . APPENDECTOMY  2000  . BACK SURGERY     x2  . CHOLECYSTECTOMY  2001  . COLONOSCOPY N/A 05/12/2015   Procedure: COLONOSCOPY;  Surgeon: Rogene Houston, MD;  Location: [facility];  Service: Endoscopy;  Laterality: N/A;  1220 - moved to 10:25 - Ann to notify pt  . NECK SURGERY     x2  . PARTIAL HYSTERECTOMY     Age 41, uterine cancer  . THYMECTOMY     11/06/2009, Dr Servando Snare   . THYROID SURGERY    . TUMOR EXCISION     Chest     OB History   No obstetric history on file.     Family History  Problem Relation Age of Onset  . Lung cancer Mother   . Heart failure Father   . Cirrhosis Father     Social History   Tobacco Use  . Smoking status: Current Every Day Smoker    Packs/day: 1.00    Types: Cigarettes    Start date: 03/05/1974  . Smokeless tobacco: Never Used  . Tobacco comment: 1/2 pack a day since age 50  Substance Use Topics  . Alcohol use: No    Alcohol/week: 0.0 standard drinks  . Drug use: No    Home Medications Prior to Admission medications   Medication Sig Start Date End Date Taking? Authorizing Provider  albuterol (  PROVENTIL) (2.5 MG/3ML) 0.083% nebulizer solution Take 2.5 mg by nebulization as needed for wheezing or shortness of breath.   Yes [provider]  amitriptyline (ELAVIL) 25 MG tablet Take 1-2 tablets by mouth at bedtime. 07/28/19  Yes [provider]  amLODipine (NORVASC) 2.5 MG tablet Take 2.5 mg by mouth every morning. 01/20/21  Yes [provider]  aspirin EC 81 MG tablet Take 81 mg by mouth daily.   Yes [provider]  diazepam (VALIUM) 5 MG tablet Take 1 tablet by mouth 3 (three) times daily as needed for muscle spasms or anxiety. 07/09/19  Yes [provider]  ibuprofen (ADVIL,MOTRIN) 200 MG tablet Take 200 mg by mouth every 6 (six) hours as needed for fever, headache or mild pain. Pt takes aleve, Can NOT take ibuprofen   Yes [provider]  losartan (COZAAR) 100 MG  tablet Take 100 mg by mouth daily.  03/24/14  Yes [provider]  meloxicam (MOBIC) 7.5 MG tablet Take 7.5 mg by mouth daily. 03/07/21  Yes [provider]  oxcarbazepine (TRILEPTAL) 600 MG tablet Take 300 mg by mouth 2 (two) times daily.   Yes [provider]  SYNTHROID 88 MCG tablet Take 88 mcg by mouth daily before breakfast.  04/26/13  Yes [provider]    Allergies    Levothyroxine sodium, Morphine, and Penicillins  Review of Systems   Review of Systems  Constitutional: Negative for chills, fatigue and fever.  HENT: Negative for congestion and trouble swallowing.   Eyes: Negative for visual disturbance.  Respiratory: Positive for shortness of breath. Negative for cough and wheezing.   Cardiovascular: Positive for chest pain. Negative for palpitations.  Gastrointestinal: Negative for abdominal pain, nausea and vomiting.  Genitourinary: Negative for dysuria and flank pain.  Musculoskeletal: Positive for arthralgias (left shoulder pain). Negative for back pain, myalgias and neck pain.  Skin: Negative for color change and rash.  Neurological: Negative for dizziness, syncope, weakness, numbness and headaches.  Hematological: Does not bruise/bleed easily.  Psychiatric/Behavioral: Negative for confusion.    Physical Exam Updated Vital Signs BP (!) 183/79 (BP Location: Left Arm)   Pulse 66   Temp 98.3 F (36.8 C) (Oral)   Resp 19   Ht 5\' 5"  (1.651 m)   Wt 65.8 kg   SpO2 97%   BMI 24.13 kg/m   Physical Exam Vitals and nursing note reviewed.  Constitutional:      Appearance: Normal appearance. She is not ill-appearing or toxic-appearing.  HENT:     Head: Normocephalic.     Mouth/Throat:     Mouth: Mucous membranes are moist.  Neck:     Thyroid: No thyromegaly.     Meningeal: Kernig's sign absent.  Cardiovascular:     Rate and Rhythm: Normal rate and regular rhythm.     Pulses: Normal pulses.  Pulmonary:     Effort: Pulmonary effort is  normal.     Breath sounds: Normal breath sounds. No wheezing.  Chest:     Chest wall: No tenderness.  Abdominal:     Palpations: Abdomen is soft.     Tenderness: There is no abdominal tenderness. There is no guarding or rebound.  Musculoskeletal:        General: Tenderness present.     Cervical back: Normal range of motion. No tenderness.     Right lower leg: No edema.     Left lower leg: No edema.     Comments: Diffuse ttp of the left  anterior shoulder joint.  Pain with attempted abduction.  Grips symmetrical.    Skin:    General: Skin is warm.     Capillary Refill: Capillary refill takes less than 2 seconds.     Findings: No bruising, erythema or rash.  Neurological:     General: No focal deficit present.     Mental Status: She is alert.     Sensory: No sensory deficit.     Motor: No weakness.     ED Results / Procedures / Treatments   Labs (all labs ordered are listed, but only abnormal results are displayed) Labs Reviewed  BASIC METABOLIC PANEL - Abnormal; Notable for the following components:      Result Value   Sodium 128 (*)    Chloride 92 (*)    All other components within normal limits  CBC - Abnormal; Notable for the following components:   MCV 100.5 (*)    All other components within normal limits  D-DIMER, QUANTITATIVE - Abnormal; Notable for the following components:   D-Dimer, Quant 1.07 (*)    All other components within normal limits  TROPONIN I (HIGH SENSITIVITY)  TROPONIN I (HIGH SENSITIVITY)    EKG EKG Interpretation  Date/Time:  Friday March 16 2021 09:31:07 EDT Ventricular Rate:  68 PR Interval:    QRS Duration: 136 QT Interval:  414 QTC Calculation: 441 R Axis:   28 Text Interpretation: Sinus rhythm Baseline wander V5 Confirmed by Octaviano Glow [id number]) on 03/17/2021 1:47:34 PM   Radiology CT Angio Chest PE W and/or Wo Contrast  Result Date: 03/16/2021 CLINICAL DATA:  PE suspected EXAM: CT ANGIOGRAPHY CHEST WITH CONTRAST TECHNIQUE:  Multidetector CT imaging of the chest was performed using the standard protocol during bolus administration of intravenous contrast. Multiplanar CT image reconstructions and MIPs were obtained to evaluate the vascular anatomy. CONTRAST:  39mL OMNIPAQUE IOHEXOL 350 MG/ML SOLN COMPARISON:  04/26/2017 FINDINGS: Cardiovascular: Satisfactory opacification of the pulmonary arteries to the segmental level. No evidence of pulmonary embolism. Normal heart size. Left coronary artery calcifications. No pericardial effusion. Aortic atherosclerosis. Mediastinum/Nodes: No enlarged mediastinal, hilar, or axillary lymph nodes. Calcified subcarinal and right hilar lymph nodes. Thyroid gland, trachea, and esophagus demonstrate no significant findings. Lungs/Pleura: Mild centrilobular emphysema. Diffuse bilateral bronchial wall thickening. No pleural effusion or pneumothorax. Fat containing right-sided Bochdalek's hernia (series 6, image 114). Upper Abdomen: No acute abnormality. Musculoskeletal: No chest wall abnormality. No acute or significant osseous findings. Review of the MIP images confirms the above findings. IMPRESSION: 1. Negative examination for pulmonary embolism. 2. Diffuse bilateral bronchial wall thickening, consistent with nonspecific infectious or inflammatory bronchitis. 3. Emphysema. 4. Coronary artery disease. Aortic Atherosclerosis (ICD10-I70.0) and Emphysema (ICD10-J43.9). Electronically Signed   By: Eddie Candle M.D.   On: 03/16/2021 12:52   DG Chest Port 1 View  Result Date: 03/16/2021 CLINICAL DATA:  Chest pain short of breath EXAM: PORTABLE CHEST 1 VIEW COMPARISON:  05/27/2013 FINDINGS: 3 wires in the upper sternum unchanged. Heart size upper normal. Normal vascularity. Atherosclerotic calcification aortic arch. Lungs are clear without infiltrate or effusion. Epicardial fat pad adjacent to the left heart border. IMPRESSION: No active disease. Electronically Signed   By: Franchot Gallo M.D.   On:  03/16/2021 10:26    Procedures Procedures   Medications Ordered in ED Medications  iohexol (OMNIPAQUE) 350 MG/ML injection 75 mL (75 mLs Intravenous Contrast Given 03/16/21 1225)    ED Course  I have reviewed the triage vital signs and the nursing notes.  Pertinent labs & imaging results that were available during my care of the patient were reviewed by me and considered in my medical decision making (see chart for details).    MDM Rules/Calculators/A&P                           Pt sent here for evaluation by PCP.  3 week hx of left upper CP and left shoulder pain.  She has been evaluated and treated for shoulder pain by PCP and urgent care w/o relief.  Intermittent SOB.  Exam favors musculoskeletal, but given her risk factors, will further work up possible cardiac or pulmonary process.   CXR reassuring and labs show elevated dimer, mild hyponatremia, no other electrolyte abnml, no leukocytosis and delta trop unchanged.  EKG w/o acute ischemic changes.  Will process with CT angio of chest.    CT angio w/o evidence of PE.  Care plan discussed with Dr. Roderic Palau.  I feel pt is appropriate for d/c home.  We will contact her cardiologist office and arrange for close out pt f/u  Patient has been scheduled to follow-up with cardiology, Dr. Harl Bowie at 10 AM on 03/21/2021.  Pt agreeable to plan   Final Clinical Impression(s) / ED Diagnoses Final diagnoses:  Atypical chest pain    Rx / DC Orders ED Discharge Orders    None       Kem Parkinson, PA-C 03/18/21 0932    Milton Ferguson, MD 03/18/21 1524

## 2021-03-16 NOTE — Discharge Instructions (Signed)
The CT of your chest today did not show evidence of a blood clot.  Your work-up was reassuring.  You have been scheduled to see your cardiologist, Dr. Harl Bowie on 03/21/2021 at 10 AM.  Please keep this appointment.  Also, you may call your orthopedics office to arrange follow-up appointment regarding your shoulder pain.

## 2021-03-16 NOTE — ED Triage Notes (Signed)
Pt presents to ED with complaints of left sided chest pain radiating into left shoulder off and on for a couple of weeks. Pt states she also has SOB with it.

## 2021-03-21 ENCOUNTER — Ambulatory Visit: Payer: Medicare HMO | Admitting: Cardiology

## 2021-03-21 ENCOUNTER — Other Ambulatory Visit: Payer: Self-pay

## 2021-03-21 ENCOUNTER — Encounter: Payer: Self-pay | Admitting: Cardiology

## 2021-03-21 VITALS — BP 142/86 | HR 70 | Ht 65.0 in | Wt 144.0 lb

## 2021-03-21 DIAGNOSIS — R079 Chest pain, unspecified: Secondary | ICD-10-CM | POA: Diagnosis not present

## 2021-03-21 NOTE — Progress Notes (Signed)
Clinical Summary Ms. Flicker is a 70 y.o.female seen today for a focused visit for recent ER visit with chest pain.   1. CAD - 03/2017 CT chest: aortic and coronary calcifications - sharp pain midchest, can occur at anytime. 4/10 in severity. Lasts just a few seconds. No other asociated symptoms. Often occurs at night with laying down, can be positional. Pain started 4-5 years. Less frequent than before. - some DOE with house work, which somewhat new. - she reports remote history 20 years ago of a blockage in her heart. Unclear details at [facility].  - she had prior thymectomy in 2010 with scar. - she reports increased stress due to family members moving back home   03/16/21 ER visit with chest pain - pain with movement of left arm - trop neg x 2. Ddimer 1/07. EKG SR, no ischemic changes - CT PE no PE, emphysema, CAD - prior left rotator cuff surgery left arm  - squeezing feeling midchest/epgiastric. Occurs at night, 4-5 times over the last month. +SOB. Not positional. Lasts a few minutes. Differet form pain - does housework and yardwork, some SOB with activities  - separate left shoulder and arm, worst with movement. Can feel that area popping.     Past Medical History:  Diagnosis Date  . Chronic back pain   . Chronic neck pain   . COPD (chronic obstructive pulmonary disease) ([place])   . Coronary atherosclerosis of native coronary artery    Possible diagnosis - details not complete  . Essential hypertension, benign   . History of MRSA infection   . History of uterine cancer    Age 57  . Seizures (HCC)      Allergies  Allergen Reactions  . Levothyroxine Sodium Other (See Comments)    Generic synthroid caused altered mental   . Morphine   . Penicillins      Current Outpatient Medications  Medication Sig Dispense Refill  . albuterol (PROVENTIL) (2.5 MG/3ML) 0.083% nebulizer solution Take 2.5 mg by nebulization as needed for wheezing or shortness of breath.    Marland Kitchen  amitriptyline (ELAVIL) 25 MG tablet Take 1-2 tablets by mouth at bedtime.    Marland Kitchen amLODipine (NORVASC) 2.5 MG tablet Take 2.5 mg by mouth every morning.    Marland Kitchen aspirin EC 81 MG tablet Take 81 mg by mouth daily.    . diazepam (VALIUM) 5 MG tablet Take 1 tablet by mouth 3 (three) times daily as needed for muscle spasms or anxiety.    Marland Kitchen ibuprofen (ADVIL,MOTRIN) 200 MG tablet Take 200 mg by mouth every 6 (six) hours as needed for fever, headache or mild pain. Pt takes aleve, Can NOT take ibuprofen    . losartan (COZAAR) 100 MG tablet Take 100 mg by mouth daily.     . meloxicam (MOBIC) 7.5 MG tablet Take 7.5 mg by mouth daily.    Marland Kitchen oxcarbazepine (TRILEPTAL) 600 MG tablet Take 300 mg by mouth 2 (two) times daily.    Marland Kitchen SYNTHROID 88 MCG tablet Take 88 mcg by mouth daily before breakfast.      No current facility-administered medications for this visit.     Past Surgical History:  Procedure Laterality Date  . APPENDECTOMY  2000  . BACK SURGERY     x2  . CHOLECYSTECTOMY  2001  . COLONOSCOPY N/A 05/12/2015   Procedure: COLONOSCOPY;  Surgeon: Rogene Houston, MD;  [facility];  Service: Endoscopy;  Laterality: N/A;  1220 - moved to  10:25 - Ann to notify pt  . NECK SURGERY     x2  . PARTIAL HYSTERECTOMY     Age 52, uterine cancer  . THYMECTOMY     11/06/2009, Dr Servando Snare   . THYROID SURGERY    . TUMOR EXCISION     Chest     Allergies  Allergen Reactions  . Levothyroxine Sodium Other (See Comments)    Generic synthroid caused altered mental   . Morphine   . Penicillins       Family History  Problem Relation Age of Onset  . Lung cancer Mother   . Heart failure Father   . Cirrhosis Father      Social History Ms. Heier reports that she has been smoking cigarettes. She started smoking about 47 years ago. She has been smoking about 1.00 pack per day. She has never used smokeless tobacco. Ms. Moroz reports no history of alcohol use.   Review of Systems CONSTITUTIONAL: No  weight loss, fever, chills, weakness or fatigue.  HEENT: Eyes: No visual loss, blurred vision, double vision or yellow sclerae.No hearing loss, sneezing, congestion, runny nose or sore throat.  SKIN: No rash or itching.  CARDIOVASCULAR: per hpi RESPIRATORY: per hpi GASTROINTESTINAL: No anorexia, nausea, vomiting or diarrhea. No abdominal pain or blood.  GENITOURINARY: No burning on urination, no polyuria NEUROLOGICAL: No headache, dizziness, syncope, paralysis, ataxia, numbness or tingling in the extremities. No change in bowel or bladder control.  MUSCULOSKELETAL: No muscle, back pain, joint pain or stiffness.  LYMPHATICS: No enlarged nodes. No history of splenectomy.  PSYCHIATRIC: No history of depression or anxiety.  ENDOCRINOLOGIC: No reports of sweating, cold or heat intolerance. No polyuria or polydipsia.  Marland Kitchen   Physical Examination Today's Vitals   03/21/21 0950  BP: (!) 142/86  Pulse: 70  SpO2: 95%  Weight: 144 lb (65.3 kg)  Height: 5\' 5"  (1.651 m)   Body mass index is 23.96 kg/m.  Gen: resting comfortably, no acute distress HEENT: no scleral icterus, pupils equal round and reactive, no palptable cervical adenopathy,  CV: RRR, no m/r/g, no jvd Resp: Clear to auscultation bilaterally GI: abdomen is soft, non-tender, non-distended, normal bowel sounds, no hepatosplenomegaly MSK: extremities are warm, no edema.  Skin: warm, no rash Neuro:  no focal deficits Psych: appropriate affect   Diagnostic Studies  10/2009 nuclear stress IMPRESSION: Overall normal exercise Myoview as outlined. There were no diagnostic ST-segment changes noted at a maximum workload of seven METS. No chest pain was reported. There was a hypertensive response to exercise. Perfusion imaging demonstrates breast attenuation artifact without evidence of ischemia, and overall normal ejection fraction of 68%.   Assessment and Plan  1. Chest pain/CAD - left shoulder and arm pain is not  cardiac - she describes a separate squeezing feeling midchest with associated SOB of unclear etiology. Will obtain a lexiscan to further evaluate      Arnoldo Lenis, M.D.

## 2021-03-21 NOTE — Patient Instructions (Signed)
Medication Instructions:  Your physician recommends that you continue on your current medications as directed. Please refer to the Current Medication list given to you today.  *If you need a refill on your cardiac medications before your next appointment, please call your pharmacy*   Lab Work: None If you have labs (blood work) drawn today and your tests are completely normal, you will receive your results only by: Marland Kitchen MyChart Message (if you have MyChart) OR . A paper copy in the mail If you have any lab test that is abnormal or we need to change your treatment, we will call you to review the results.   Testing/Procedures: Your physician has requested that you have a lexiscan myoview. For further information please visit [email or web address]. Please follow instruction sheet, as given.     Follow-Up: At [facility], you and your health needs are our priority.  As part of our continuing mission to provide you with exceptional heart care, we have created designated Provider Care Teams.  These Care Teams include your primary Cardiologist (physician) and Advanced Practice Providers (APPs -  Physician Assistants and Nurse Practitioners) who all work together to provide you with the care you need, when you need it.  We recommend signing up for the patient portal called "MyChart".  Sign up information is provided on this After Visit Summary.  MyChart is used to connect with patients for Virtual Visits (Telemedicine).  Patients are able to view lab/test results, encounter notes, upcoming appointments, etc.  Non-urgent messages can be sent to your provider as well.   To learn more about what you can do with MyChart, go to [email or web address].    Your next appointment:   6 month(s)  The format for your next appointment:   In Person  Provider:   Carlyle Dolly, MD   Other Instructions  Cardiac Nuclear Scan A cardiac nuclear scan is a test that is done to check the flow of blood to  your heart. It is done when you are resting and when you are exercising. The test looks for problems such as:  Not enough blood reaching a portion of the heart.  The heart muscle not working as it should. You may need this test if:  You have heart disease.  You have had lab results that are not normal.  You have had heart surgery or a balloon procedure to open up blocked arteries (angioplasty).  You have chest pain.  You have shortness of breath. In this test, a special dye (tracer) is put into your bloodstream. The tracer will travel to your heart. A camera will then take pictures of your heart to see how the tracer moves through your heart. This test is usually done at a hospital and takes 2-4 hours. Tell a doctor about:  Any allergies you have.  All medicines you are taking, including vitamins, herbs, eye drops, creams, and over-the-counter medicines.  Any problems you or family members have had with anesthetic medicines.  Any blood disorders you have.  Any surgeries you have had.  Any medical conditions you have.  Whether you are pregnant or may be pregnant. What are the risks? Generally, this is a safe test. However, problems may occur, such as:  Serious chest pain and heart attack. This is only a risk if the stress portion of the test is done.  Rapid heartbeat.  A feeling of warmth in your chest. This feeling usually does not last long.  Allergic reaction to the tracer. What happens before the  test?  Ask your doctor about changing or stopping your normal medicines. This is important.  Follow instructions from your doctor about what you cannot eat or drink.  Remove your jewelry on the day of the test. What happens during the test?  An IV tube will be inserted into one of your veins.  Your doctor will give you a small amount of tracer through the IV tube.  You will wait for 20-40 minutes while the tracer moves through your bloodstream.  Your heart will be  monitored with an electrocardiogram (ECG).  You will lie down on an exam table.  Pictures of your heart will be taken for about 15-20 minutes.  You may also have a stress test. For this test, one of these things may be done: ? You will be asked to exercise on a treadmill or a stationary bike. ? You will be given medicines that will make your heart work harder. This is done if you are unable to exercise.  When blood flow to your heart has peaked, a tracer will again be given through the IV tube.  After 20-40 minutes, you will get back on the exam table. More pictures will be taken of your heart.  Depending on the tracer that is used, more pictures may need to be taken 3-4 hours later.  Your IV tube will be removed when the test is over. The test may vary among doctors and hospitals. What happens after the test?  Ask your doctor: ? Whether you can return to your normal schedule, including diet, activities, and medicines. ? Whether you should drink more fluids. This will help to remove the tracer from your body. Drink enough fluid to keep your pee (urine) pale yellow.  Ask your doctor, or the department that is doing the test: ? When will my results be ready? ? How will I get my results? Summary  A cardiac nuclear scan is a test that is done to check the flow of blood to your heart.  Tell your doctor whether you are pregnant or may be pregnant.  Before the test, ask your doctor about changing or stopping your normal medicines. This is important.  Ask your doctor whether you can return to your normal activities. You may be asked to drink more fluids. This information is not intended to replace advice given to you by your health care provider. Make sure you discuss any questions you have with your health care provider. Document Revised: 04/07/2019 Document Reviewed: 06/01/2018 Elsevier Patient Education  [place].

## 2021-03-28 DIAGNOSIS — I10 Essential (primary) hypertension: Secondary | ICD-10-CM | POA: Diagnosis not present

## 2021-03-28 DIAGNOSIS — E039 Hypothyroidism, unspecified: Secondary | ICD-10-CM | POA: Diagnosis not present

## 2021-03-28 DIAGNOSIS — J449 Chronic obstructive pulmonary disease, unspecified: Secondary | ICD-10-CM | POA: Diagnosis not present

## 2021-03-30 ENCOUNTER — Encounter (HOSPITAL_COMMUNITY)
Admission: RE | Admit: 2021-03-30 | Discharge: 2021-03-30 | Disposition: A | Discharge status: Home / Self Care | Payer: Medicare HMO | Source: Ambulatory Visit | Attending: Cardiology | Admitting: Cardiology

## 2021-03-30 ENCOUNTER — Ambulatory Visit (HOSPITAL_COMMUNITY)
Admission: RE | Admit: 2021-03-30 | Discharge: 2021-03-30 | Disposition: A | Discharge status: Home / Self Care | Payer: Medicare HMO | Source: Ambulatory Visit | Attending: Cardiology | Admitting: Cardiology

## 2021-03-30 ENCOUNTER — Encounter (HOSPITAL_COMMUNITY): Payer: Self-pay

## 2021-03-30 DIAGNOSIS — R079 Chest pain, unspecified: Secondary | ICD-10-CM | POA: Diagnosis not present

## 2021-03-30 HISTORY — DX: Malignant (primary) neoplasm, unspecified: C80.1

## 2021-03-30 LAB — NM MYOCAR MULTI W/SPECT W/WALL MOTION / EF
LV dias vol: 63 mL (ref 46–106)
LV sys vol: 15 mL
Peak HR: 83 {beats}/min
RATE: 0.36
Rest HR: 64 {beats}/min
SDS: 1
SRS: 3
SSS: 4
TID: 1.05

## 2021-03-30 MED ORDER — REGADENOSON 0.4 MG/5ML IV SOLN
INTRAVENOUS | Status: AC
  Administered 2021-03-30: 0.4 mg via INTRAVENOUS
  Filled 2021-03-30: qty 5

## 2021-03-30 MED ORDER — TECHNETIUM TC 99M TETROFOSMIN IV KIT
10.0000 | PACK | Freq: Once | INTRAVENOUS | Status: AC | PRN
  Administered 2021-03-30: 11 via INTRAVENOUS

## 2021-03-30 MED ORDER — TECHNETIUM TC 99M TETROFOSMIN IV KIT
30.0000 | PACK | Freq: Once | INTRAVENOUS | Status: AC | PRN
  Administered 2021-03-30: 33 via INTRAVENOUS

## 2021-03-30 MED ORDER — SODIUM CHLORIDE FLUSH 0.9 % IV SOLN
INTRAVENOUS | Status: AC
  Administered 2021-03-30: 10 mL via INTRAVENOUS
  Filled 2021-03-30: qty 10

## 2021-04-24 ENCOUNTER — Ambulatory Visit: Payer: Medicare HMO

## 2021-04-24 ENCOUNTER — Other Ambulatory Visit: Payer: Self-pay

## 2021-04-24 ENCOUNTER — Encounter: Payer: Self-pay | Admitting: Orthopaedic Surgery

## 2021-04-24 ENCOUNTER — Ambulatory Visit (INDEPENDENT_AMBULATORY_CARE_PROVIDER_SITE_OTHER): Payer: Medicare HMO | Admitting: Orthopaedic Surgery

## 2021-04-24 VITALS — BP 164/88 | HR 77 | Ht 65.0 in | Wt 146.0 lb

## 2021-04-24 DIAGNOSIS — G8929 Other chronic pain: Secondary | ICD-10-CM

## 2021-04-24 DIAGNOSIS — F17210 Nicotine dependence, cigarettes, uncomplicated: Secondary | ICD-10-CM

## 2021-04-24 DIAGNOSIS — M25512 Pain in left shoulder: Secondary | ICD-10-CM | POA: Diagnosis not present

## 2021-04-24 NOTE — Patient Instructions (Signed)

## 2021-04-24 NOTE — Progress Notes (Signed)
Subjective:    Patient ID: Erin Cooper, female    DOB: Nov 24, 1951, 70 y.o.   MRN: [id number]  HPI She has had pain in the left shoulder for six to eight weeks getting worse.  She went to Urgent Care in [place] around the first of April for her shoulder pain.  She has pain with overhead use and sleeping on it.  Her motion has gotten worse. She had X-rays then but did not bring in any disc.  She saw Dr. Collene Mares at [facility] a few days after the Urgent Care visit.  He gave an injection into the left shoulder.  She said it helped only a little.  She then saw Dr. Hilma Favors last week and he has referred her here.  She has no trauma.  She has no redness, no numbness, no swelling.  She is getting slowly worse.  She has tried Advil, ice and heat. 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   Review of Systems  Constitutional: Positive for activity change.  Respiratory: Positive for shortness of breath.   Musculoskeletal: Positive for arthralgias, myalgias and neck pain.  All other systems reviewed and are negative.  For Review of Systems, all other systems reviewed and are negative.  The following is a summary of the past history medically, past history surgically, known current medicines, social history and family history.  This information is gathered electronically by the computer from prior information and documentation.  I review this each visit and have found including this information at this point in the chart is beneficial and informative.   Past Medical History:  Diagnosis Date  . Cancer (HCC)    Uterine  . Chronic back pain   . Chronic neck pain   . COPD (chronic obstructive pulmonary disease) ([place])   . Coronary atherosclerosis of native coronary artery    Possible diagnosis - details not complete  . Essential hypertension, benign   . History of MRSA infection   . History of uterine cancer    Age 70  . Seizures ([place])     Past Surgical History:  Procedure Laterality  Date  . APPENDECTOMY  2000  . BACK SURGERY     x2  . CHOLECYSTECTOMY  2001  . COLONOSCOPY N/A 05/12/2015   Procedure: COLONOSCOPY;  Surgeon: Rogene Houston, MD;  [facility];  Service: Endoscopy;  Laterality: N/A;  1220 - moved to 10:25 - Ann to notify pt  . NECK SURGERY     x2  . PARTIAL HYSTERECTOMY     Age 22, uterine cancer  . THYMECTOMY     11/06/2009, Dr Servando Snare   . THYROID SURGERY    . TUMOR EXCISION     Chest    Current Outpatient Medications on File Prior to Visit  Medication Sig Dispense Refill  . albuterol (PROVENTIL) (2.5 MG/3ML) 0.083% nebulizer solution Take 2.5 mg by nebulization as needed for wheezing or shortness of breath.    Marland Kitchen amitriptyline (ELAVIL) 25 MG tablet Take 1-2 tablets by mouth at bedtime.    Marland Kitchen amLODipine (NORVASC) 2.5 MG tablet Take 2.5 mg by mouth every morning.    Marland Kitchen aspirin EC 81 MG tablet Take 81 mg by mouth daily.    . diazepam (VALIUM) 5 MG tablet Take 1 tablet by mouth 3 (three) times daily as needed for muscle spasms or anxiety.    Marland Kitchen losartan (COZAAR) 100 MG tablet Take 100 mg by mouth daily.     Marland Kitchen  meloxicam (MOBIC) 7.5 MG tablet Take 7.5 mg by mouth daily.    Marland Kitchen oxcarbazepine (TRILEPTAL) 600 MG tablet Take 300 mg by mouth 2 (two) times daily.    Marland Kitchen SYNTHROID 88 MCG tablet Take 88 mcg by mouth daily before breakfast.      No current facility-administered medications on file prior to visit.    Social History   Socioeconomic History  . Marital status: Widowed    Spouse name: Not on file  . Number of children: Not on file  . Years of education: Not on file  . Highest education level: Not on file  Occupational History  . Occupation: Disabled  Tobacco Use  . Smoking status: Current Every Day Smoker    Packs/day: 0.50    Types: Cigarettes    Start date: 03/05/1974  . Smokeless tobacco: Never Used  . Tobacco comment: 1/2 pack a day since age 46  Vaping Use  . Vaping Use: Never used  Substance and Sexual Activity  . Alcohol  use: No    Alcohol/week: 0.0 standard drinks  . Drug use: No  . Sexual activity: Not on file  Other Topics Concern  . Not on file  Social History Narrative   Married with 3 children   Social Determinants of Health   Financial Resource Strain: Not on file  Food Insecurity: Not on file  Transportation Needs: Not on file  Physical Activity: Not on file  Stress: Not on file  Social Connections: Not on file  Intimate Partner Violence: Not on file    Family History  Problem Relation Age of Onset  . Lung cancer Mother   . Heart failure Father   . Cirrhosis Father     BP (!) 164/88   Pulse 77   Ht 5\' 5"  (1.651 m)   Wt 146 lb (66.2 kg)   BMI 24.30 kg/m   Body mass index is 24.3 kg/m.     Objective:   Physical Exam Vitals and nursing note reviewed. Exam conducted with a chaperone present.  Constitutional:      Appearance: She is well-developed.  HENT:     Head: Normocephalic and atraumatic.  Eyes:     Conjunctiva/sclera: Conjunctivae normal.     Pupils: Pupils are equal, round, and reactive to light.  Cardiovascular:     Rate and Rhythm: Normal rate and regular rhythm.  Pulmonary:     Effort: Pulmonary effort is normal.  Abdominal:     Palpations: Abdomen is soft.  Musculoskeletal:       Arms:     Cervical back: Normal range of motion and neck supple.  Skin:    General: Skin is warm and dry.  Neurological:     Mental Status: She is alert and oriented to person, place, and time.     Cranial Nerves: No cranial nerve deficit.     Motor: No abnormal muscle tone.     Coordination: Coordination normal.     Deep Tendon Reflexes: Reflexes are normal and symmetric. Reflexes normal.  Psychiatric:        Behavior: Behavior normal.        Thought Content: Thought content normal.        Judgment: Judgment normal.   x-rays were done of the left shoulder, reported separately.        Assessment & Plan:   Encounter Diagnoses  Name Primary?  . Chronic left  shoulder pain Yes  . Nicotine dependence, cigarettes, uncomplicated    PROCEDURE NOTE:  The patient request injection, verbal consent was obtained.  The left shoulder was prepped appropriately after time out was performed.   Sterile technique was observed and injection of 1 cc of Celestone 6 mg with several cc's of plain xylocaine. Anesthesia was provided by ethyl chloride and a 20-gauge needle was used to inject the shoulder area. A posterior approach was used.  The injection was tolerated well.  A band aid dressing was applied.  The patient was advised to apply ice later today and tomorrow to the injection sight as needed.  Begin OT to the left shoulder.  Advil two three times a day pc.  Return in two weeks.  She had rotator cuff surgery in the past.  She may have new tear. She may need MRI.  Call if any problem.  Precautions discussed.   Electronically Signed Sanjuana Kava, MD 4/26/202210:53 AM

## 2021-04-24 NOTE — Progress Notes (Signed)
amb  

## 2021-04-28 DIAGNOSIS — E039 Hypothyroidism, unspecified: Secondary | ICD-10-CM | POA: Diagnosis not present

## 2021-04-28 DIAGNOSIS — J449 Chronic obstructive pulmonary disease, unspecified: Secondary | ICD-10-CM | POA: Diagnosis not present

## 2021-04-28 DIAGNOSIS — I10 Essential (primary) hypertension: Secondary | ICD-10-CM | POA: Diagnosis not present

## 2021-05-09 ENCOUNTER — Other Ambulatory Visit: Payer: Self-pay

## 2021-05-09 ENCOUNTER — Ambulatory Visit (HOSPITAL_COMMUNITY): Payer: Medicare HMO | Attending: Orthopaedic Surgery | Admitting: Specialist

## 2021-05-09 ENCOUNTER — Encounter (HOSPITAL_COMMUNITY): Payer: Self-pay | Admitting: Specialist

## 2021-05-09 DIAGNOSIS — M25612 Stiffness of left shoulder, not elsewhere classified: Secondary | ICD-10-CM

## 2021-05-09 DIAGNOSIS — M25512 Pain in left shoulder: Secondary | ICD-10-CM

## 2021-05-09 DIAGNOSIS — R29898 Other symptoms and signs involving the musculoskeletal system: Secondary | ICD-10-CM | POA: Diagnosis not present

## 2021-05-09 NOTE — Therapy (Signed)
[place], [place], [address] Phone: [phone]   Fax:  [phone]  Occupational Therapy Evaluation  Patient Details  Name: Erin Cooper MRN: [id number] Date of Birth: 11/10/1951 Referring Provider (OT): Dr. Sanjuana Kava   Encounter Date: 05/09/2021   OT End of Session - 05/09/21 1606    Visit Number 1    Number of Visits 6    Date for OT Re-Evaluation 06/20/21    Authorization Type [employer], requesting 6 visits from 05/09/21-06/20/21    Authorization - Visit Number 1    Authorization - Number of Visits 6    Progress Note Due on Visit 10    OT Start Time 1430    OT Stop Time 1515    OT Time Calculation (min) 45 min    Activity Tolerance Patient tolerated treatment well    Behavior During Therapy [facility] for tasks assessed/performed           Past Medical History:  Diagnosis Date  . Cancer (HCC)    Uterine  . Chronic back pain   . Chronic neck pain   . COPD (chronic obstructive pulmonary disease) ([place])   . Coronary atherosclerosis of native coronary artery    Possible diagnosis - details not complete  . Essential hypertension, benign   . History of MRSA infection   . History of uterine cancer    Age 70  . Seizures ([place])     Past Surgical History:  Procedure Laterality Date  . APPENDECTOMY  2000  . BACK SURGERY     x2  . CHOLECYSTECTOMY  2001  . COLONOSCOPY N/A 05/12/2015   Procedure: COLONOSCOPY;  Surgeon: Rogene Houston, MD;  [facility];  Service: Endoscopy;  Laterality: N/A;  1220 - moved to 10:25 - Ann to notify pt  . NECK SURGERY     x2  . PARTIAL HYSTERECTOMY     Age 70, uterine cancer  . THYMECTOMY     11/06/2009, Dr Servando Snare   . THYROID SURGERY    . TUMOR EXCISION     Chest    There were no vitals filed for this visit.   Subjective Assessment - 05/09/21 1604    Subjective  S:  I had rotator cuff surgery about 15 years ago in this arm and this feels the same.   The MD said I had to come to therapy first before I had an MRI    Special Tests FOTO  47% independent    Patient Stated Goals I want to get rid of the pain and avoid surgery    Currently in Pain? Yes    Pain Score 5     Pain Location Shoulder    Pain Orientation Left;Lateral;Upper    Pain Descriptors / Indicators Aching;Radiating    Pain Type Acute pain    Pain Radiating Towards upper arm    Pain Onset More than a month ago    Pain Frequency Intermittent    Aggravating Factors  reaching to the side or overhead    Pain Relieving Factors heat    Effect of Pain on Daily Activities moderate             [facility] OT Assessment - 05/09/21 0001      Assessment   Medical Diagnosis Left Shoulder Pain    Referring Provider (OT) Dr. Sanjuana Kava    Onset Date/Surgical Date --   2 months ago   Hand Dominance Right  Precautions   Precautions None      Restrictions   Weight Bearing Restrictions No      Balance Screen   Has the patient fallen in the past 6 months No    Has the patient had a decrease in activity level because of a fear of falling?  No    Is the patient reluctant to leave their home because of a fear of falling?  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    Leisure work, mowing the yard      ADL   ADL comments difficulty using left arm to reach forward, out to side, can't lift anything, unable to reach behind back or head      Cognition   Overall Cognitive Status Within Functional Limits for tasks assessed      Observation/Other Assessments   Focus on Therapeutic Outcomes (FOTO)  57% independent      Sensation   Light Touch Appears Intact      Coordination   Gross Motor Movements are Fluid and Coordinated Yes    Fine Motor Movements are Fluid and Coordinated Yes      ROM / Strength   AROM / PROM / Strength AROM;PROM;Strength       Palpation   Palpation comment mod-max fascial restrictions in left shoulder region      AROM   Overall AROM Comments assessed in seated, external and internal rotation with shoulder adducted    AROM Assessment Site Shoulder    Right/Left Shoulder Left    Left Shoulder Flexion 90 Degrees    Left Shoulder ABduction 50 Degrees    Left Shoulder Internal Rotation 80 Degrees    Left Shoulder External Rotation 39 Degrees      PROM   Overall PROM Comments assessed in supine    PROM Assessment Site Shoulder    Right/Left Shoulder Left    Left Shoulder Flexion 160 Degrees    Left Shoulder ABduction 160 Degrees    Left Shoulder Internal Rotation 90 Degrees    Left Shoulder External Rotation 55 Degrees      Strength   Overall Strength Comments assessed in seated    Strength Assessment Site Shoulder    Right/Left Shoulder Left    Left Shoulder Flexion 4/5    Left Shoulder ABduction 3+/5    Left Shoulder Internal Rotation 4/5    Left Shoulder External Rotation 4/5                    OT Treatments/Exercises (OP) - 05/09/21 0001      Manual Therapy   Manual Therapy Myofascial release    Manual therapy comments manual therapy completed seperately than all other interventions this date of service    Myofascial Release myofascial release and manual stretching to left upper arm, scapular, and shoulder region to decrease pain and restrictions and improve pain free mobiity in left shoulder.                 OT Education - 05/09/21 1605    Education Details aa/rom in supine    Person(s) Educated Patient    Methods Explanation;Demonstration;Handout    Comprehension Verbalized understanding;Returned demonstration            OT Short Term Goals - 05/09/21 2140      OT SHORT TERM GOAL #1  Title Patient will be educated and independent with HEP for improved independence with daily tasks.    Time 6    Period Weeks    Status New    Target Date 06/20/21      OT SHORT  TERM GOAL #2   Title Patient will improve left shoulder a/rom to [facility] for improved ability to place items on overhead shelf, turn steering wheel, and don shirts.    Time 6    Period Weeks    Status New      OT SHORT TERM GOAL #3   Title Patient will improve left shoulder strength to 5/5 for improved ability to lift groceries, laundry baskets and mow the yard.    Time 6    Period Weeks    Status New      OT SHORT TERM GOAL #4   Title Patient will decrease pain in her left shoulder to 3/10 or better when completing daily tasks.    Time 6    Period Weeks    Status New      OT SHORT TERM GOAL #5   Title Patient will decrease fascial restrictions to minimal in her left shoulder region.    Time 6    Period Weeks    Status New                    Plan - 05/09/21 2136    Clinical Impression Statement A:  Patient is a 70 year old female with past medical history significant for several neck and back surgeries, epilepsy, and rotator cuff repair 15 years ago.  She has been experiencing decreased mobility and increased pain in her left shoulder for over 2 months.  She is not able to reach over her head or behind her head, or out to the side due to increased pain.  She is not able to use her left hand to turn the steering wheel while driving.    OT Occupational Profile and History Problem Focused Assessment - Including review of records relating to presenting problem    Occupational performance deficits (Please refer to evaluation for details): ADL's;IADL's;Work    Body Structure / Function / Physical Skills ADL;Muscle spasms;Fascial restriction;IADL;Pain;Strength;ROM    Rehab Potential Good    Clinical Decision Making Limited treatment options, no task modification necessary    Comorbidities Affecting Occupational Performance: None    Modification or Assistance to Complete Evaluation  No modification of tasks or assist necessary to complete eval    OT Frequency 1x / week    OT  Duration 6 weeks    OT Treatment/Interventions Self-care/ADL training;Cryotherapy;Therapeutic exercise;DME and/or AE instruction;Manual Therapy;Energy conservation;Passive range of motion;Therapeutic activities;Patient/family education;Moist Heat    Plan P:  Skilled OT intervention to decrease pain and improve strength and mobility in her left shoulder in order to return to PLOF with desired daily tasks.  Next session:  review and update HEP, manual therapy, aa/rom progressing to a/rom as tolerated.    OT Home Exercise Plan eval:  aa/rom in supine    Consulted and Agree with Plan of Care Patient           Patient will benefit from skilled therapeutic intervention in order to improve the following deficits and impairments:   Body Structure / Function / Physical Skills: ADL,Muscle spasms,Fascial restriction,IADL,Pain,Strength,ROM       Visit Diagnosis: Acute pain of left shoulder  Stiffness of left shoulder, not elsewhere classified  Other symptoms and signs involving the musculoskeletal  system    Problem List Patient Active Problem List   Diagnosis Date Noted  . Syncope 04/20/2014  . HYPOTHYROIDISM 05/29/2010  . Essential hypertension, benign 10/31/2009  . CORONARY ATHEROSCLEROSIS NATIVE CORONARY ARTERY 10/31/2009    Vangie Bicker, [place], OTR/L [phone]  05/09/2021, 9:52 PM  [facility] [address] [place], [place], [address] Phone: [phone]   Fax:  [phone]  Name: TRESHA MUZIO MRN: [id number] Date of Birth: 1951/05/11

## 2021-05-09 NOTE — Patient Instructions (Signed)

## 2021-05-15 ENCOUNTER — Ambulatory Visit: Payer: Medicare HMO | Admitting: Orthopaedic Surgery

## 2021-05-15 ENCOUNTER — Other Ambulatory Visit: Payer: Self-pay

## 2021-05-15 ENCOUNTER — Ambulatory Visit (HOSPITAL_COMMUNITY): Payer: Medicare HMO | Admitting: Occupational Therapy

## 2021-05-15 ENCOUNTER — Encounter (HOSPITAL_COMMUNITY): Payer: Self-pay | Admitting: Occupational Therapy

## 2021-05-15 ENCOUNTER — Encounter: Payer: Self-pay | Admitting: Orthopaedic Surgery

## 2021-05-15 VITALS — BP 165/89 | HR 64 | Resp 16 | Ht 65.0 in | Wt 143.2 lb

## 2021-05-15 DIAGNOSIS — F17210 Nicotine dependence, cigarettes, uncomplicated: Secondary | ICD-10-CM

## 2021-05-15 DIAGNOSIS — M25612 Stiffness of left shoulder, not elsewhere classified: Secondary | ICD-10-CM

## 2021-05-15 DIAGNOSIS — G8929 Other chronic pain: Secondary | ICD-10-CM

## 2021-05-15 DIAGNOSIS — M25512 Pain in left shoulder: Secondary | ICD-10-CM | POA: Diagnosis not present

## 2021-05-15 DIAGNOSIS — R29898 Other symptoms and signs involving the musculoskeletal system: Secondary | ICD-10-CM

## 2021-05-15 NOTE — Progress Notes (Signed)
Patient Erin Cooper, female DOB:06/19/1951, 70 y.o. AVW:[id number]  Chief Complaint  Patient presents with  . Shoulder Pain    Chronic left shoulder    HPI  Erin Cooper is a 70 y.o. female who continues with left shoulder pain. She has been to OT but is still hurting.  She had rotator cuff surgery in the past and says this feels like it did then on the other shoulder.  She is not improving.  I will get MRI.   Body mass index is 23.83 kg/m.  ROS  Review of Systems  Constitutional: Positive for activity change.  Respiratory: Positive for shortness of breath.   Musculoskeletal: Positive for arthralgias, myalgias and neck pain.  All other systems reviewed and are negative.   All other systems reviewed and are negative.  The following is a summary of the past history medically, past history surgically, known current medicines, social history and family history.  This information is gathered electronically by the computer from prior information and documentation.  I review this each visit and have found including this information at this point in the chart is beneficial and informative.    Past Medical History:  Diagnosis Date  . Cancer (HCC)    Uterine  . Chronic back pain   . Chronic neck pain   . COPD (chronic obstructive pulmonary disease) ([place])   . Coronary atherosclerosis of native coronary artery    Possible diagnosis - details not complete  . Essential hypertension, benign   . History of MRSA infection   . History of uterine cancer    Age 89  . Seizures ([place])     Past Surgical History:  Procedure Laterality Date  . APPENDECTOMY  2000  . BACK SURGERY     x2  . CHOLECYSTECTOMY  2001  . COLONOSCOPY N/A 05/12/2015   Procedure: COLONOSCOPY;  Surgeon: Rogene Houston, MD;  [facility];  Service: Endoscopy;  Laterality: N/A;  1220 - moved to 10:25 - Ann to notify pt  . NECK SURGERY     x2  . PARTIAL HYSTERECTOMY     Age 16, uterine cancer  .  THYMECTOMY     11/06/2009, Dr Servando Snare   . THYROID SURGERY    . TUMOR EXCISION     Chest    Family History  Problem Relation Age of Onset  . Lung cancer Mother   . Heart failure Father   . Cirrhosis Father     Social History Social History   Tobacco Use  . Smoking status: Current Every Day Smoker    Packs/day: 0.50    Types: Cigarettes    Start date: 03/05/1974  . Smokeless tobacco: Never Used  . Tobacco comment: 1/2 pack a day since age 34  Vaping Use  . Vaping Use: Never used  Substance Use Topics  . Alcohol use: No    Alcohol/week: 0.0 standard drinks  . Drug use: No    Allergies  Allergen Reactions  . Levothyroxine Sodium Other (See Comments)    Generic synthroid caused altered mental   . Morphine   . Penicillins     Current Outpatient Medications  Medication Sig Dispense Refill  . albuterol (PROVENTIL) (2.5 MG/3ML) 0.083% nebulizer solution Take 2.5 mg by nebulization as needed for wheezing or shortness of breath.    Marland Kitchen amitriptyline (ELAVIL) 25 MG tablet Take 1-2 tablets by mouth at bedtime.    Marland Kitchen amLODipine (NORVASC) 2.5 MG tablet Take 2.5 mg by mouth every  morning.    Marland Kitchen aspirin EC 81 MG tablet Take 81 mg by mouth daily.    . diazepam (VALIUM) 5 MG tablet Take 1 tablet by mouth 3 (three) times daily as needed for muscle spasms or anxiety.    Marland Kitchen losartan (COZAAR) 100 MG tablet Take 100 mg by mouth daily.     . meloxicam (MOBIC) 7.5 MG tablet Take 7.5 mg by mouth daily.    Marland Kitchen oxcarbazepine (TRILEPTAL) 600 MG tablet Take 300 mg by mouth 2 (two) times daily.    Marland Kitchen SYNTHROID 88 MCG tablet Take 88 mcg by mouth daily before breakfast.      No current facility-administered medications for this visit.     Physical Exam  Blood pressure (!) 165/89, pulse 64, resp. rate 16, height 5\' 5"  (1.651 m), weight 143 lb 3.2 oz (65 kg).  Constitutional: overall normal hygiene, normal nutrition, well developed, normal grooming, normal body habitus. Assistive  device:none  Musculoskeletal: gait and station Limp none, muscle tone and strength are normal, no tremors or atrophy is present.  .  Neurological: coordination overall normal.  Deep tendon reflex/nerve stretch intact.  Sensation normal.  Cranial nerves II-XII intact.   Skin:   Normal overall no scars, lesions, ulcers or rashes. No psoriasis.  Psychiatric: Alert and oriented x 3.  Recent memory intact, remote memory unclear.  Normal mood and affect. Well groomed.  Good eye contact.  Cardiovascular: overall no swelling, no varicosities, no edema bilaterally, normal temperatures of the legs and arms, no clubbing, cyanosis and good capillary refill.  Lymphatic: palpation is normal.  Shoulder on left painful ROM, NV intact.   All other systems reviewed and are negative   The patient has been educated about the nature of the problem(s) and counseled on treatment options.  The patient appeared to understand what I have discussed and is in agreement with it.  Encounter Diagnoses  Name Primary?  . Chronic left shoulder pain Yes  . Nicotine dependence, cigarettes, uncomplicated    I have reviewed the OT notes.  PLAN Call if any problems.  Precautions discussed.  Continue current medications.   Return to clinic 3 weeks   Continue OT.  Get MRI of the left shoulder to rule out rotator cuff tear.  Electronically Signed Sanjuana Kava, MD 5/17/20229:13 AM

## 2021-05-15 NOTE — Patient Instructions (Signed)
Please call Central Scheduling at  [phone] to schedule your MRI. °

## 2021-05-15 NOTE — Therapy (Signed)
[place], [place], [address] Phone: [phone]   Fax:  [phone]  Occupational Therapy Treatment  Patient Details  Name: Erin Cooper MRN: [id number] Date of Birth: 01-13-1951 Referring Provider (OT): Dr. Sanjuana Kava   Encounter Date: 05/15/2021   OT End of Session - 05/15/21 1614    Visit Number 2    Number of Visits 6    Date for OT Re-Evaluation 06/20/21    Authorization Type [employer], requesting 6 visits from 05/09/21-06/20/21    Authorization - Visit Number 2    Authorization - Number of Visits 6    Progress Note Due on Visit 10    OT Start Time 1257    OT Stop Time 1341    OT Time Calculation (min) 44 min    Activity Tolerance Patient tolerated treatment well    Behavior During Therapy [facility] for tasks assessed/performed           Past Medical History:  Diagnosis Date  . Cancer (HCC)    Uterine  . Chronic back pain   . Chronic neck pain   . COPD (chronic obstructive pulmonary disease) ([place])   . Coronary atherosclerosis of native coronary artery    Possible diagnosis - details not complete  . Essential hypertension, benign   . History of MRSA infection   . History of uterine cancer    Age 70  . Seizures ([place])     Past Surgical History:  Procedure Laterality Date  . APPENDECTOMY  2000  . BACK SURGERY     x2  . CHOLECYSTECTOMY  2001  . COLONOSCOPY N/A 05/12/2015   Procedure: COLONOSCOPY;  Surgeon: Rogene Houston, MD;  [facility];  Service: Endoscopy;  Laterality: N/A;  1220 - moved to 10:25 - Ann to notify pt  . NECK SURGERY     x2  . PARTIAL HYSTERECTOMY     Age 53, uterine cancer  . THYMECTOMY     11/06/2009, Dr Servando Snare   . THYROID SURGERY    . TUMOR EXCISION     Chest    There were no vitals filed for this visit.   Subjective Assessment - 05/15/21 1256    Subjective  S: I have had a busy day. The doctor said this morning that he would set up an MRI.     Currently in Pain? Yes    Pain Score 8     Pain Location Shoulder    Pain Orientation Left;Lower;Upper    Pain Descriptors / Indicators Aching;Radiating    Pain Type Acute pain    Pain Radiating Towards upper arm    Pain Onset More than a month ago    Pain Frequency Constant    Aggravating Factors  reaching to the side and overhead    Pain Relieving Factors heat, medication    Effect of Pain on Daily Activities moderate              [facility] OT Assessment - 05/15/21 0001      Assessment   Medical Diagnosis Left Shoulder Pain    Referring Provider (OT) Dr. Sanjuana Kava      Precautions   Precautions None                    OT Treatments/Exercises (OP) - 05/15/21 0001      Exercises   Exercises Shoulder      Shoulder Exercises: Supine   Protraction AAROM;10  reps    Horizontal ABduction AAROM;10 reps    External Rotation AAROM;10 reps    Internal Rotation AAROM;10 reps    Flexion AAROM;10 reps    ABduction AAROM;10 reps   ~50 to 60% of A/ROM. More difficulty.     Shoulder Exercises: Seated   Protraction AAROM;10 reps    Horizontal ABduction AAROM;10 reps    External Rotation AAROM;10 reps    Internal Rotation AAROM;10 reps    Flexion AAROM;10 reps    Abduction AAROM;10 reps   ~ 90* this date.     Shoulder Exercises: Standing   Other Standing Exercises PVC pipe flexion x10.      Shoulder Exercises: ROM/Strengthening   Wall Wash 1' wall wash    Thumb Tacks 1'   cuing for technique     Manual Therapy   Manual Therapy Myofascial release    Manual therapy comments manual therapy completed seperately than all other interventions this date of service    Myofascial Release myofascial release and manual stretching to left upper arm, scapular, and shoulder region to decrease pain and restrictions and improve pain free mobiity in left shoulder.                  OT Education - 05/15/21 1613    Education Details Pt educated to continue AA/ROM in supine  to increase range prior to moving to seated or standing AA/ROM at home.    Person(s) Educated Patient    Methods Explanation;Demonstration;Handout    Comprehension Verbalized understanding;Returned demonstration            OT Short Term Goals - 05/09/21 2140      OT SHORT TERM GOAL #1   Title Patient will be educated and independent with HEP for improved independence with daily tasks.    Time 6    Period Weeks    Status New    Target Date 06/20/21      OT SHORT TERM GOAL #2   Title Patient will improve left shoulder a/rom to [facility] for improved ability to place items on overhead shelf, turn steering wheel, and don shirts.    Time 6    Period Weeks    Status New      OT SHORT TERM GOAL #3   Title Patient will improve left shoulder strength to 5/5 for improved ability to lift groceries, laundry baskets and mow the yard.    Time 6    Period Weeks    Status New      OT SHORT TERM GOAL #4   Title Patient will decrease pain in her left shoulder to 3/10 or better when completing daily tasks.    Time 6    Period Weeks    Status New      OT SHORT TERM GOAL #5   Title Patient will decrease fascial restrictions to minimal in her left shoulder region.    Time 6    Period Weeks    Status New                    Plan - 05/15/21 1615    Clinical Impression Statement A: Pt reported increased pain this date to 8/10 in L shoulder. Pt able to complete AA/ROM exercises in supin as well as PVC pipe flexion, wall wash, and thumb tacks with SPV due to need for cuing for form and technique. Pt demonstrates most pronounced limitation in shoulder abdution supine and seated which is at ~90* for AA/ROM  with dowel rod. Pt able to go ~160* AA/ROM for shoulder flexion seated and WFL AA/ROM when supine but with grimacing.    Occupational performance deficits (Please refer to evaluation for details): ADL's;IADL's;Work    Body Structure / Function / Physical Skills ADL;Muscle spasms;Fascial  restriction;IADL;Pain;Strength;ROM    OT Treatment/Interventions Self-care/ADL training;Cryotherapy;Therapeutic exercise;DME and/or AE instruction;Manual Therapy;Energy conservation;Passive range of motion;Therapeutic activities;Patient/family education;Moist Heat    Plan P: Continue supine progressing to seated AA/ROM. Continue pvc pip flexion and other A/AROM exercises. Pt reported being scheduled for an MRI. Check documentation for MRI report when completed.    OT Home Exercise Plan eval:  aa/rom in supine    Consulted and Agree with Plan of Care Patient           Patient will benefit from skilled therapeutic intervention in order to improve the following deficits and impairments:   Body Structure / Function / Physical Skills: ADL,Muscle spasms,Fascial restriction,IADL,Pain,Strength,ROM       Visit Diagnosis: Acute pain of left shoulder  Stiffness of left shoulder, not elsewhere classified  Other symptoms and signs involving the musculoskeletal system    Problem List Patient Active Problem List   Diagnosis Date Noted  . Syncope 04/20/2014  . HYPOTHYROIDISM 05/29/2010  . Essential hypertension, benign 10/31/2009  . CORONARY ATHEROSCLEROSIS NATIVE CORONARY ARTERY 10/31/2009   Larey Seat OT, MOT  Larey Seat 05/15/2021, 4:21 PM  [facility] [address] [place], [place], [address] Phone: [phone]   Fax:  [phone]  Name: Erin Cooper MRN: [id number] Date of Birth: 20-Feb-1951

## 2021-05-15 NOTE — Progress Notes (Signed)
mri

## 2021-05-22 ENCOUNTER — Ambulatory Visit (HOSPITAL_COMMUNITY): Payer: Medicare HMO | Admitting: Occupational Therapy

## 2021-05-22 ENCOUNTER — Encounter (HOSPITAL_COMMUNITY): Payer: Self-pay | Admitting: Occupational Therapy

## 2021-05-22 ENCOUNTER — Other Ambulatory Visit: Payer: Self-pay

## 2021-05-22 DIAGNOSIS — M25612 Stiffness of left shoulder, not elsewhere classified: Secondary | ICD-10-CM | POA: Diagnosis not present

## 2021-05-22 DIAGNOSIS — M25512 Pain in left shoulder: Secondary | ICD-10-CM

## 2021-05-22 DIAGNOSIS — R29898 Other symptoms and signs involving the musculoskeletal system: Secondary | ICD-10-CM | POA: Diagnosis not present

## 2021-05-22 NOTE — Therapy (Signed)
[facility] [place], [place], [address] Phone: [phone]   Fax:  [phone]  Occupational Therapy Treatment  Patient Details  Name: Erin Cooper MRN: [id number] Date of Birth: [birth year] Referring Provider (OT): Dr. Sanjuana Kava   Encounter Date: 05/22/2021   OT End of Session - 05/22/21 1518    Visit Number 3    Number of Visits 6    Date for OT Re-Evaluation 06/20/21    Authorization Type [employer], requesting 6 visits from 05/09/21-06/20/21    Authorization - Visit Number 3    Authorization - Number of Visits 6    Progress Note Due on Visit 10    OT Start Time 1256    OT Stop Time 1342    OT Time Calculation (min) 46 min    Activity Tolerance Patient tolerated treatment well    Behavior During Therapy [facility] for tasks assessed/performed           Past Medical History:  Diagnosis Date  . Cancer (HCC)    Uterine  . Chronic back pain   . Chronic neck pain   . COPD (chronic obstructive pulmonary disease) ([place])   . Coronary atherosclerosis of native coronary artery    Possible diagnosis - details not complete  . Essential hypertension, benign   . History of MRSA infection   . History of uterine cancer    Age 70  . Seizures ([place])     Past Surgical History:  Procedure Laterality Date  . APPENDECTOMY  2000  . BACK SURGERY     x2  . CHOLECYSTECTOMY  2001  . COLONOSCOPY N/A 05/12/2015   Procedure: COLONOSCOPY;  Surgeon: Rogene Houston, MD;  [facility];  Service: Endoscopy;  Laterality: N/A;  1220 - moved to 10:25 - Ann to notify pt  . NECK SURGERY     x2  . PARTIAL HYSTERECTOMY     Age 70, uterine cancer  . THYMECTOMY     11/06/2009, Dr Servando Snare   . THYROID SURGERY    . TUMOR EXCISION     Chest    There were no vitals filed for this visit.   Subjective Assessment - 05/22/21 1256    Subjective  s: The pain is a little better. June the first I am set up for an MRI.    Currently in  Pain? Yes    Pain Score 6     Pain Location Shoulder    Pain Orientation Left;Lower;Upper    Pain Descriptors / Indicators Aching;Radiating    Pain Type Chronic pain    Pain Radiating Towards upper arm    Pain Onset More than a month ago    Pain Frequency Constant    Aggravating Factors  reaching to the side and overhead    Pain Relieving Factors heat, medication (alive)    Effect of Pain on Daily Activities moderate    Multiple Pain Sites No              [facility] OT Assessment - 05/22/21 1252      Assessment   Medical Diagnosis Left Shoulder Pain    Referring Provider (OT) Dr. Sanjuana Kava      Precautions   Precautions None                    OT Treatments/Exercises (OP) - 05/22/21 0001      Exercises   Exercises Shoulder      Shoulder  Exercises: Supine   Protraction AAROM;12 reps    Horizontal ABduction AAROM;12 reps    External Rotation AAROM;12 reps    Internal Rotation AAROM;12 reps    Flexion AAROM;12 reps   grimacing   ABduction AAROM;10 reps;PROM   x10 P/ROM. ~75% available ROM when completing AA/ROM. groaning and grimacing. Rest break taken after ~6.     Shoulder Exercises: Seated   Protraction AAROM;10 reps    Horizontal ABduction AAROM;10 reps    External Rotation AAROM;10 reps    Internal Rotation AAROM;10 reps    Flexion AAROM;10 reps    Abduction AAROM;10 reps   near 75% available ROM.     Shoulder Exercises: Standing   Other Standing Exercises PVC pipe flexion x10.      Shoulder Exercises: ROM/Strengthening   Proximal Shoulder Strengthening, Supine x12 paddle, circles forward and reverse, x pattern.      Manual Therapy   Manual Therapy Myofascial release    Manual therapy comments manual therapy completed seperately than all other interventions this date of service    Myofascial Release myofascial release and manual stretching to left upper arm, scapular, and shoulder region to decrease pain and restrictions and improve pain free  mobiity in left shoulder.   Trigger point release used in upper trapezius region.                   OT Short Term Goals - 05/09/21 2140      OT SHORT TERM GOAL #1   Title Patient will be educated and independent with HEP for improved independence with daily tasks.    Time 6    Period Weeks    Status New    Target Date 06/20/21      OT SHORT TERM GOAL #2   Title Patient will improve left shoulder a/rom to [facility] for improved ability to place items on overhead shelf, turn steering wheel, and don shirts.    Time 6    Period Weeks    Status New      OT SHORT TERM GOAL #3   Title Patient will improve left shoulder strength to 5/5 for improved ability to lift groceries, laundry baskets and mow the yard.    Time 6    Period Weeks    Status New      OT SHORT TERM GOAL #4   Title Patient will decrease pain in her left shoulder to 3/10 or better when completing daily tasks.    Time 6    Period Weeks    Status New      OT SHORT TERM GOAL #5   Title Patient will decrease fascial restrictions to minimal in her left shoulder region.    Time 6    Period Weeks    Status New                    Plan - 05/22/21 1518    Clinical Impression Statement A: Pt reported decrease in pain from last session to 6/10 this date. Pt demonstrated WFL AA/ROM in supine and sitting for all exercises except abduction. Pt still limited to ~145* with AA/ROM for abductoin. A/ROM limited to near 90*. Moderate cuing needed for AA/ROM, specifically for Abduction. Pt able to complete proximal shoulder strengthening in supine and was provided written handout for the exercises completed.    Occupational performance deficits (Please refer to evaluation for details): ADL's;IADL's;Work    OT Treatment/Interventions Self-care/ADL training;Cryotherapy;Therapeutic exercise;DME and/or AE instruction;Manual Therapy;Energy conservation;Passive range  of motion;Therapeutic activities;Patient/family education;Moist  Heat    Plan P: Continue supine and seated AA/ROM. Attempt to progress to A/ROM but observe abduction for any increase in ROM. Check MRI results after pt has the appointment.    OT Home Exercise Plan eval:  aa/rom in supine; 5/24: prximal shoulder girdle strengthening paddles, "x", and circles.    Consulted and Agree with Plan of Care Patient           Patient will benefit from skilled therapeutic intervention in order to improve the following deficits and impairments:           Visit Diagnosis: Stiffness of left shoulder, not elsewhere classified  Acute pain of left shoulder  Other symptoms and signs involving the musculoskeletal system    Problem List Patient Active Problem List   Diagnosis Date Noted  . Syncope 04/20/2014  . HYPOTHYROIDISM 05/29/2010  . Essential hypertension, benign 10/31/2009  . CORONARY ATHEROSCLEROSIS NATIVE CORONARY ARTERY 10/31/2009   Larey Seat OT, MOT   Larey Seat 05/22/2021, 3:30 PM  [facility] [address] [place], [place], [address] Phone: [phone]   Fax:  [phone]  Name: LANDRA HOWZE MRN: [id number] Date of Birth: August 26, 1951

## 2021-05-22 NOTE — Patient Instructions (Signed)
In supine (laying on a bed): 3 sets x10 reps 2 to 3 times a day 7 days a week.  x10 paddles, x10 circles forward and reverse, x10 "X" pattern.

## 2021-05-29 DIAGNOSIS — J449 Chronic obstructive pulmonary disease, unspecified: Secondary | ICD-10-CM | POA: Diagnosis not present

## 2021-05-29 DIAGNOSIS — I10 Essential (primary) hypertension: Secondary | ICD-10-CM | POA: Diagnosis not present

## 2021-05-29 DIAGNOSIS — E039 Hypothyroidism, unspecified: Secondary | ICD-10-CM | POA: Diagnosis not present

## 2021-05-30 ENCOUNTER — Ambulatory Visit (HOSPITAL_COMMUNITY): Payer: Medicare HMO

## 2021-05-31 ENCOUNTER — Ambulatory Visit (HOSPITAL_COMMUNITY)
Admission: RE | Admit: 2021-05-31 | Discharge: 2021-05-31 | Disposition: A | Discharge status: Home / Self Care | Payer: Medicare HMO | Source: Ambulatory Visit | Attending: Orthopaedic Surgery | Admitting: Orthopaedic Surgery

## 2021-05-31 DIAGNOSIS — M25412 Effusion, left shoulder: Secondary | ICD-10-CM | POA: Diagnosis not present

## 2021-05-31 DIAGNOSIS — G8929 Other chronic pain: Secondary | ICD-10-CM | POA: Insufficient documentation

## 2021-05-31 DIAGNOSIS — M25512 Pain in left shoulder: Secondary | ICD-10-CM | POA: Diagnosis not present

## 2021-05-31 DIAGNOSIS — F17210 Nicotine dependence, cigarettes, uncomplicated: Secondary | ICD-10-CM | POA: Insufficient documentation

## 2021-05-31 DIAGNOSIS — M19012 Primary osteoarthritis, left shoulder: Secondary | ICD-10-CM | POA: Diagnosis not present

## 2021-06-05 ENCOUNTER — Other Ambulatory Visit: Payer: Self-pay

## 2021-06-05 ENCOUNTER — Encounter: Payer: Self-pay | Admitting: Orthopaedic Surgery

## 2021-06-05 ENCOUNTER — Ambulatory Visit: Payer: Medicare HMO | Admitting: Orthopaedic Surgery

## 2021-06-05 VITALS — BP 151/73 | HR 76 | Ht 65.0 in | Wt 140.0 lb

## 2021-06-05 DIAGNOSIS — M67814 Other specified disorders of tendon, left shoulder: Secondary | ICD-10-CM | POA: Diagnosis not present

## 2021-06-05 DIAGNOSIS — G8929 Other chronic pain: Secondary | ICD-10-CM

## 2021-06-05 DIAGNOSIS — F17210 Nicotine dependence, cigarettes, uncomplicated: Secondary | ICD-10-CM | POA: Diagnosis not present

## 2021-06-05 DIAGNOSIS — M25512 Pain in left shoulder: Secondary | ICD-10-CM

## 2021-06-05 NOTE — Patient Instructions (Signed)
Steps to Quit Smoking Smoking tobacco is the leading cause of preventable death. It can affect almost every organ in the body. Smoking puts you and people around you at risk for many serious, long-lasting (chronic) diseases. Quitting smoking can be hard, but it is one of the best things that you can do for your health. It is never too late to quit. How do I get ready to quit? When you decide to quit smoking, make a plan to help you succeed. Before you quit:  Pick a date to quit. Set a date within the next 2 weeks to give you time to prepare.  Write down the reasons why you are quitting. Keep this list in places where you will see it often.  Tell your family, friends, and co-workers that you are quitting. Their support is important.  Talk with your doctor about the choices that may help you quit.  Find out if your health insurance will pay for these treatments.  Know the people, places, things, and activities that make you want to smoke (triggers). Avoid them. What first steps can I take to quit smoking?  Throw away all cigarettes at home, at work, and in your car.  Throw away the things that you use when you smoke, such as ashtrays and lighters.  Clean your car. Make sure to empty the ashtray.  Clean your home, including curtains and carpets. What can I do to help me quit smoking? Talk with your doctor about taking medicines and seeing a counselor at the same time. You are more likely to succeed when you do both.  If you are pregnant or breastfeeding, talk with your doctor about counseling or other ways to quit smoking. Do not take medicine to help you quit smoking unless your doctor tells you to do so. To quit smoking: Quit right away  Quit smoking totally, instead of slowly cutting back on how much you smoke over a period of time.  Go to counseling. You are more likely to quit if you go to counseling sessions regularly. Take medicine You may take medicines to help you quit. Some  medicines need a prescription, and some you can buy over-the-counter. Some medicines may contain a drug called nicotine to replace the nicotine in cigarettes. Medicines may:  Help you to stop having the desire to smoke (cravings).  Help to stop the problems that come when you stop smoking (withdrawal symptoms). Your doctor may ask you to use:  Nicotine patches, gum, or lozenges.  Nicotine inhalers or sprays.  Non-nicotine medicine that is taken by mouth. Find resources Find resources and other ways to help you quit smoking and remain smoke-free after you quit. These resources are most helpful when you use them often. They include:  Online chats with a counselor.  Phone quitlines.  Printed self-help materials.  Support groups or group counseling.  Text messaging programs.  Mobile phone apps. Use apps on your mobile phone or tablet that can help you stick to your quit plan. There are many free apps for mobile phones and tablets as well as websites. Examples include Quit Guide from the CDC and smokefree.gov   What things can I do to make it easier to quit?  Talk to your family and friends. Ask them to support and encourage you.  Call a phone quitline (1-800-QUIT-NOW), reach out to support groups, or work with a counselor.  Ask people who smoke to not smoke around you.  Avoid places that make you want to smoke,   such as: ? Bars. ? Parties. ? Smoke-break areas at work.  Spend time with people who do not smoke.  Lower the stress in your life. Stress can make you want to smoke. Try these things to help your stress: ? Getting regular exercise. ? Doing deep-breathing exercises. ? Doing yoga. ? Meditating. ? Doing a body scan. To do this, close your eyes, focus on one area of your body at a time from head to toe. Notice which parts of your body are tense. Try to relax the muscles in those areas.   How will I feel when I quit smoking? Day 1 to 3 weeks Within the first 24 hours,  you may start to have some problems that come from quitting tobacco. These problems are very bad 2-3 days after you quit, but they do not often last for more than 2-3 weeks. You may get these symptoms:  Mood swings.  Feeling restless, nervous, angry, or annoyed.  Trouble concentrating.  Dizziness.  Strong desire for high-sugar foods and nicotine.  Weight gain.  Trouble pooping (constipation).  Feeling like you may vomit (nausea).  Coughing or a sore throat.  Changes in how the medicines that you take for other issues work in your body.  Depression.  Trouble sleeping (insomnia). Week 3 and afterward After the first 2-3 weeks of quitting, you may start to notice more positive results, such as:  Better sense of smell and taste.  Less coughing and sore throat.  Slower heart rate.  Lower blood pressure.  Clearer skin.  Better breathing.  Fewer sick days. Quitting smoking can be hard. Do not give up if you fail the first time. Some people need to try a few times before they succeed. Do your best to stick to your quit plan, and talk with your doctor if you have any questions or concerns. Summary  Smoking tobacco is the leading cause of preventable death. Quitting smoking can be hard, but it is one of the best things that you can do for your health.  When you decide to quit smoking, make a plan to help you succeed.  Quit smoking right away, not slowly over a period of time.  When you start quitting, seek help from your doctor, family, or friends. This information is not intended to replace advice given to you by your health care provider. Make sure you discuss any questions you have with your health care provider. Document Revised: 09/10/2019 Document Reviewed: 03/06/2019 Elsevier Patient Education  2021 Elsevier Inc.  

## 2021-06-05 NOTE — Progress Notes (Signed)
My shoulder is only a little bit better.  She had MRI of the shoulder and it showed:  IMPRESSION: Rotator cuff tendinopathy without tear.  Mild to moderate acromioclavicular and moderate glenohumeral osteoarthritis. 0.4 cm loose body in the axillary recess noted.  Markedly degenerated labrum.  I have explained the findings to her.  She will not need surgery at this point.  I have independently reviewed the MRI.     She is going to OT and I have reviewed the notes.  She is making slow progress.  ROM of the left shoulder is tender and decreased.  NV intact.  Encounter Diagnoses  Name Primary?  . Chronic left shoulder pain Yes  . Nicotine dependence, cigarettes, uncomplicated    Continue OT.  Do exercises at home.  Return in one month.  Call if any problem.  Precautions discussed.   Electronically Signed Sanjuana Kava, MD 6/7/20229:11 AM

## 2021-06-06 ENCOUNTER — Ambulatory Visit (HOSPITAL_COMMUNITY): Payer: Medicare HMO | Attending: Orthopaedic Surgery | Admitting: Occupational Therapy

## 2021-06-06 ENCOUNTER — Encounter (HOSPITAL_COMMUNITY): Payer: Self-pay | Admitting: Occupational Therapy

## 2021-06-06 DIAGNOSIS — R29898 Other symptoms and signs involving the musculoskeletal system: Secondary | ICD-10-CM | POA: Insufficient documentation

## 2021-06-06 DIAGNOSIS — M25612 Stiffness of left shoulder, not elsewhere classified: Secondary | ICD-10-CM | POA: Insufficient documentation

## 2021-06-06 DIAGNOSIS — M25512 Pain in left shoulder: Secondary | ICD-10-CM | POA: Insufficient documentation

## 2021-06-06 NOTE — Therapy (Signed)
[place], [place], [address] Phone: [phone]   Fax:  [phone]  Occupational Therapy Treatment  Patient Details  Name: Erin Cooper MRN: [id number] Date of Birth: 09-Apr-1951 Referring Provider (OT): Dr. Sanjuana Kava   Encounter Date: 06/06/2021   OT End of Session - 06/06/21 1435    Visit Number 4    Number of Visits 6    Date for OT Re-Evaluation 06/20/21    Authorization Type [employer], requesting 6 visits from 05/09/21-06/20/21    Authorization - Visit Number 4    Authorization - Number of Visits 6    Progress Note Due on Visit 10    OT Start Time 1350    OT Stop Time 1430    OT Time Calculation (min) 40 min    Activity Tolerance Patient tolerated treatment well    Behavior During Therapy [facility] for tasks assessed/performed           Past Medical History:  Diagnosis Date  . Cancer (HCC)    Uterine  . Chronic back pain   . Chronic neck pain   . COPD (chronic obstructive pulmonary disease) ([place])   . Coronary atherosclerosis of native coronary artery    Possible diagnosis - details not complete  . Essential hypertension, benign   . History of MRSA infection   . History of uterine cancer    Age 70  . Seizures ([place])     Past Surgical History:  Procedure Laterality Date  . APPENDECTOMY  2000  . BACK SURGERY     x2  . CHOLECYSTECTOMY  2001  . COLONOSCOPY N/A 05/12/2015   Procedure: COLONOSCOPY;  Surgeon: Rogene Houston, MD;  [facility];  Service: Endoscopy;  Laterality: N/A;  1220 - moved to 10:25 - Ann to notify pt  . NECK SURGERY     x2  . PARTIAL HYSTERECTOMY     Age 66, uterine cancer  . THYMECTOMY     11/06/2009, Dr Servando Snare   . THYROID SURGERY    . TUMOR EXCISION     Chest    There were no vitals filed for this visit.   Subjective Assessment - 06/06/21 1351    Subjective  S: The doctor said the arthritis has eaten away half of my shoulder.    Currently in Pain?  No/denies              [facility] OT Assessment - 06/06/21 1351      Assessment   Medical Diagnosis Left Shoulder Pain      Precautions   Precautions None                    OT Treatments/Exercises (OP) - 06/06/21 1352      Exercises   Exercises Shoulder      Shoulder Exercises: Supine   Protraction AROM;10 reps    Horizontal ABduction AROM;10 reps    External Rotation PROM;5 reps;AAROM;12 reps    Internal Rotation AAROM;12 reps    Flexion AAROM;12 reps    ABduction AAROM;12 reps      Shoulder Exercises: Standing   Protraction AROM;10 reps    Horizontal ABduction AROM;10 reps    External Rotation AAROM;10 reps    Internal Rotation AAROM;10 reps    Flexion AAROM;10 reps    ABduction AAROM;10 reps      Shoulder Exercises: ROM/Strengthening   Proximal Shoulder Strengthening, Supine 10X each, no rest breaks  Manual Therapy   Manual Therapy Myofascial release    Manual therapy comments manual therapy completed seperately than all other interventions this date of service    Myofascial Release myofascial release and manual stretching to left upper arm, scapular, and shoulder region to decrease pain and restrictions and improve pain free mobiity in left shoulder.                    OT Short Term Goals - 05/09/21 2140      OT SHORT TERM GOAL #1   Title Patient will be educated and independent with HEP for improved independence with daily tasks.    Time 6    Period Weeks    Status Ongoing    Target Date 06/20/21      OT SHORT TERM GOAL #2   Title Patient will improve left shoulder a/rom to [facility] for improved ability to place items on overhead shelf, turn steering wheel, and don shirts.    Time 6    Period Weeks    Status Ongoing      OT SHORT TERM GOAL #3   Title Patient will improve left shoulder strength to 5/5 for improved ability to lift groceries, laundry baskets and mow the yard.    Time 6    Period Weeks    Status Ongoing      OT  SHORT TERM GOAL #4   Title Patient will decrease pain in her left shoulder to 3/10 or better when completing daily tasks.    Time 6    Period Weeks    Status Ongoing      OT SHORT TERM GOAL #5   Title Patient will decrease fascial restrictions to minimal in her left shoulder region.    Time 6    Period Weeks    Status Ongoing                    Plan - 06/06/21 1427    Clinical Impression Statement A: Pt reports MRI shows no tear, but significant arthritis with potential for surgery later on. Continued with myofascial release to address fascial restrictions, completed a few passive stretches to assess joint capsule tightness. Pt with P/ROM WFL with exception of er. Progressed to A/ROM for some tasks in supine and standing, continued with AA/ROM for remaining planes. OT notes anterior shift of [place] joint during abduction in standing, instructed pt to stand straight and come into slight scaption which allowed for more ROM without pain during task. Verbal cuing for form and technique.    Body Structure / Function / Physical Skills ADL;Muscle spasms;Fascial restriction;IADL;Pain;Strength;ROM    Plan P: Continue progressing towards A/ROM, attempt scapular theraband    OT Home Exercise Plan eval:  aa/rom in supine; 5/24: prximal shoulder girdle strengthening paddles, "x", and circles.    Consulted and Agree with Plan of Care Patient           Patient will benefit from skilled therapeutic intervention in order to improve the following deficits and impairments:   Body Structure / Function / Physical Skills: ADL,Muscle spasms,Fascial restriction,IADL,Pain,Strength,ROM       Visit Diagnosis: Acute pain of left shoulder  Stiffness of left shoulder, not elsewhere classified  Other symptoms and signs involving the musculoskeletal system    Problem List Patient Active Problem List   Diagnosis Date Noted  . Syncope 04/20/2014  . HYPOTHYROIDISM 05/29/2010  . Essential  hypertension, benign 10/31/2009  . CORONARY ATHEROSCLEROSIS NATIVE CORONARY ARTERY 10/31/2009  Guadelupe Sabin, OTR/L  [phone] 06/06/2021, 2:35 PM  [facility] [address] [place], [place], [address] Phone: [phone]   Fax:  [phone]  Name: Erin Cooper MRN: [id number] Date of Birth: Sep 14, 1951

## 2021-06-13 ENCOUNTER — Encounter (HOSPITAL_COMMUNITY): Payer: Medicare HMO | Admitting: Occupational Therapy

## 2021-06-20 ENCOUNTER — Ambulatory Visit (HOSPITAL_COMMUNITY): Payer: Medicare HMO

## 2021-07-03 ENCOUNTER — Ambulatory Visit: Payer: Medicare HMO | Admitting: Orthopaedic Surgery

## 2021-07-29 DIAGNOSIS — E039 Hypothyroidism, unspecified: Secondary | ICD-10-CM | POA: Diagnosis not present

## 2021-07-29 DIAGNOSIS — I10 Essential (primary) hypertension: Secondary | ICD-10-CM | POA: Diagnosis not present

## 2021-07-29 DIAGNOSIS — J449 Chronic obstructive pulmonary disease, unspecified: Secondary | ICD-10-CM | POA: Diagnosis not present

## 2021-07-31 IMAGING — MR MR SHOULDER*L* W/O CM
5 series · 40 of 40 positions shown · non-contrast
Comparison: Plain films left shoulder 04/24/2021.

CLINICAL DATA: Left shoulder pain for 2 months since a fall.

EXAM:
MRI OF THE LEFT SHOULDER WITHOUT CONTRAST
TECHNIQUE: Multiplanar, multisequence MR imaging of the shoulder was performed.
No intravenous contrast was administered.

[Series 2: T2 fat-sat · axial · left · 4.0mm · 0.49mm/px · z∈[-50,+36]mm · 8 of 20 slices shown (1 of 3)]
[im 1/20]
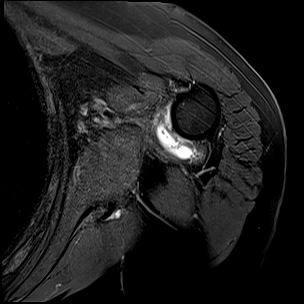
[im 3/20]
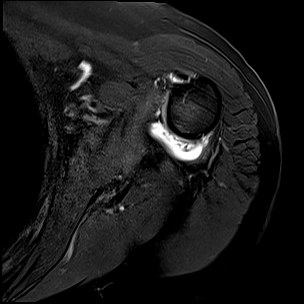
[im 6/20]
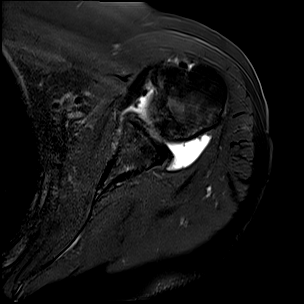
[im 9/20]
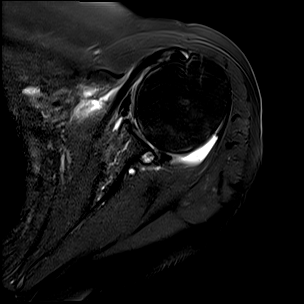
[im 11/20]
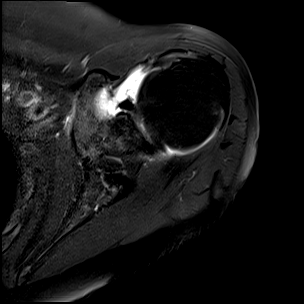
[im 14/20]
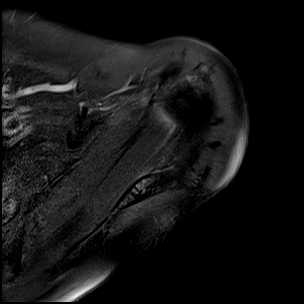
[im 17/20]
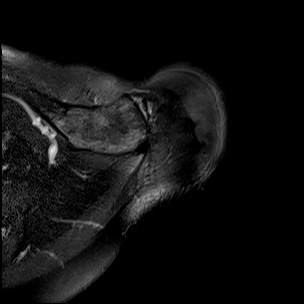
[im 20/20]
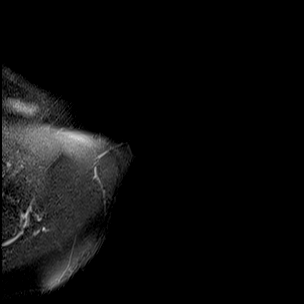

[Series 3: T1 · oblique · left · 4.0mm · 0.41mm/px · 8 of 19 slices shown]
[im 1/19]
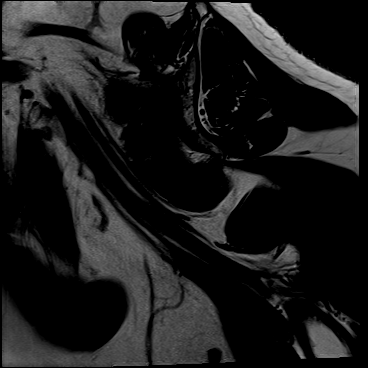
[im 3/19]
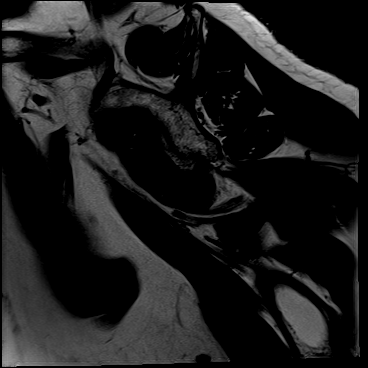
[im 6/19]
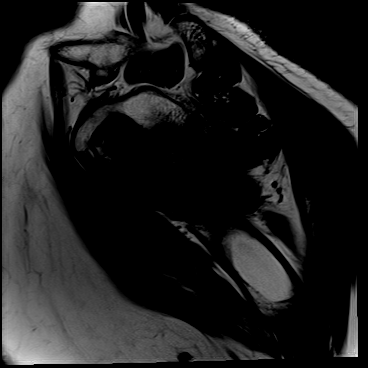
[im 8/19]
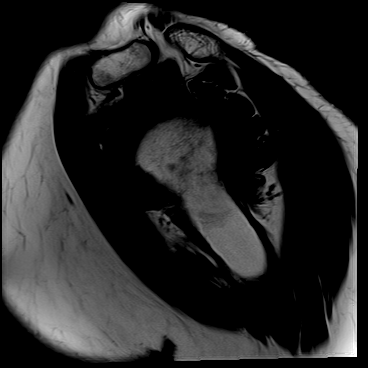
[im 11/19]
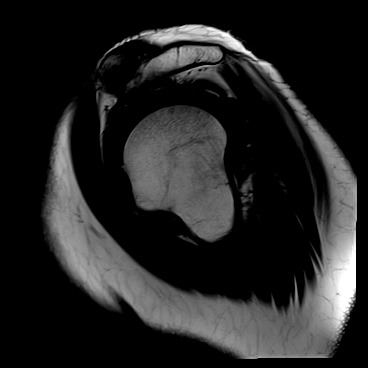
[im 13/19]
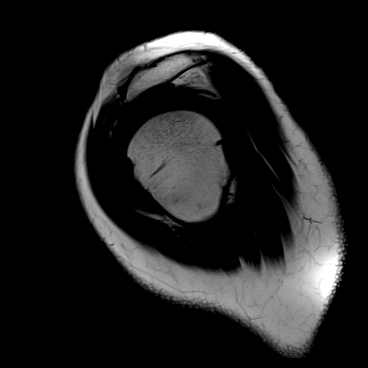
[im 16/19]
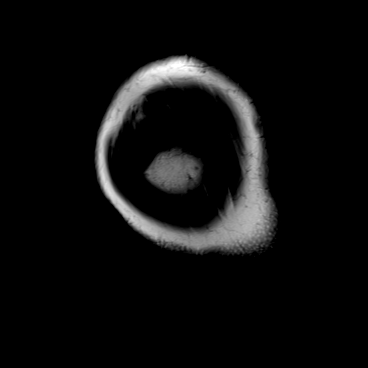
[im 19/19]
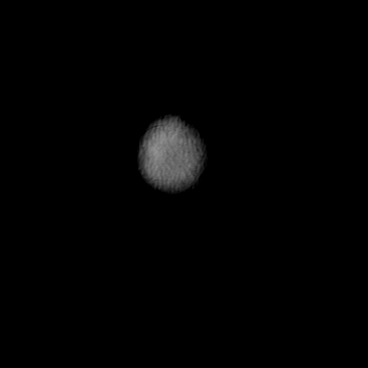

[Series 4: T2 fat-sat · oblique · left · 4.0mm · 0.47mm/px · 8 of 19 slices shown (2 of 3)]
[im 1/19]
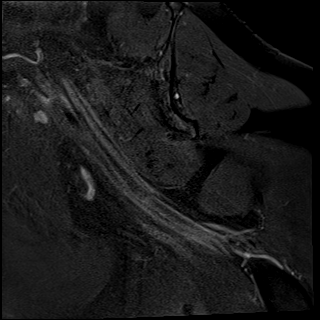
[im 3/19]
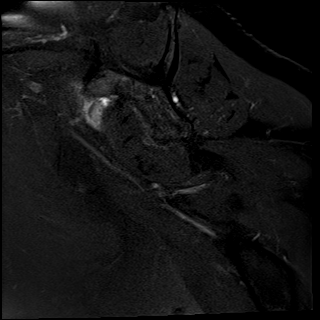
[im 6/19]
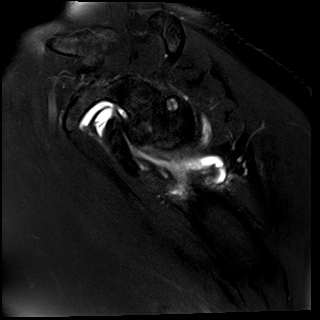
[im 8/19]
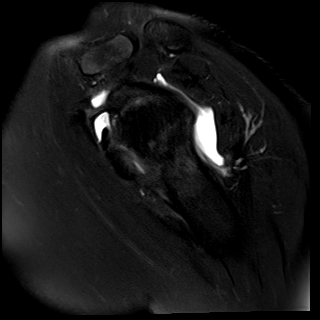
[im 11/19]
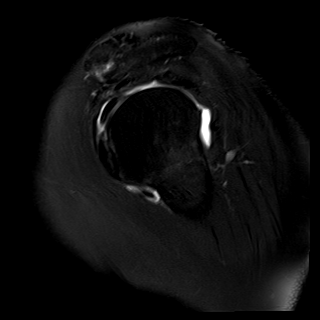
[im 13/19]
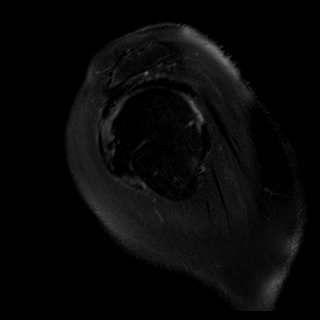
[im 16/19]
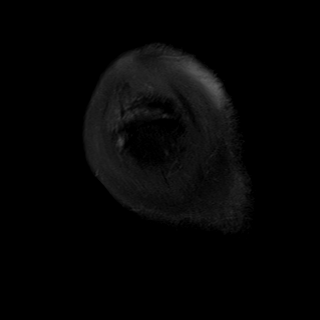
[im 19/19]
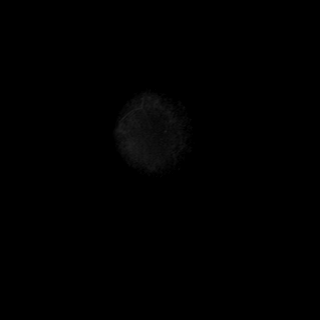

[Series 5: T2 fat-sat · oblique · left · 4.0mm · 0.47mm/px · 8 of 19 slices shown (3 of 3)]
[im 1/19]
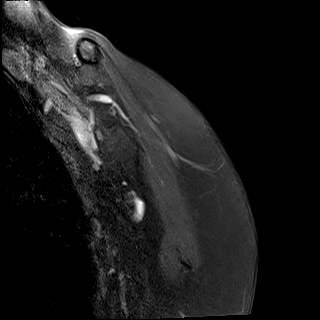
[im 3/19]
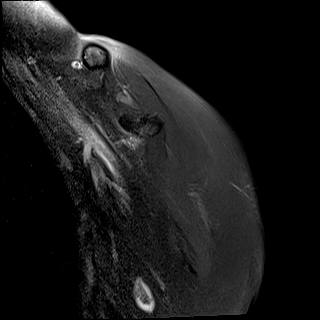
[im 6/19]
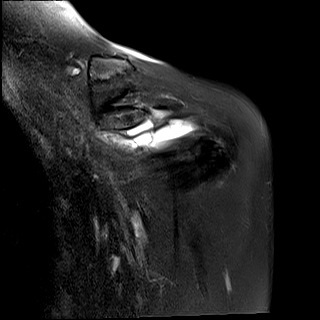
[im 8/19]
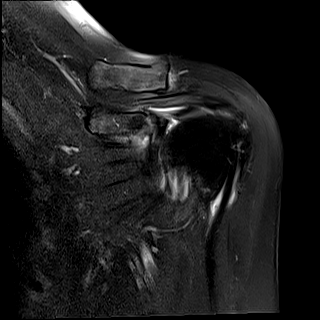
[im 11/19]
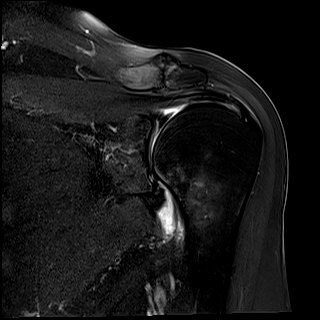
[im 13/19]
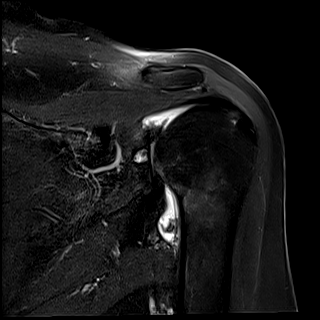
[im 16/19]
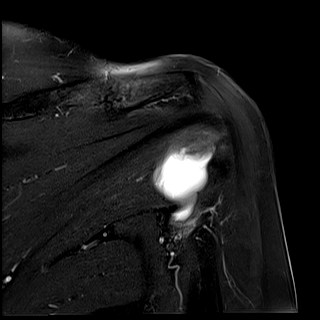
[im 19/19]
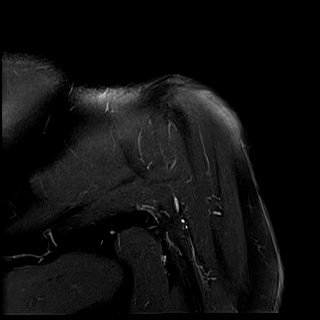

[Series 6: PD · oblique · left · 4.0mm · 0.43mm/px · 8 of 19 slices shown]
[im 1/19]
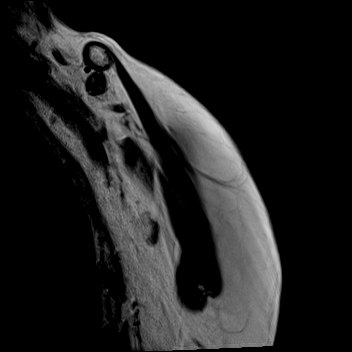
[im 3/19]
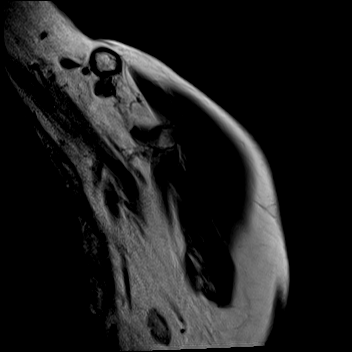
[im 6/19]
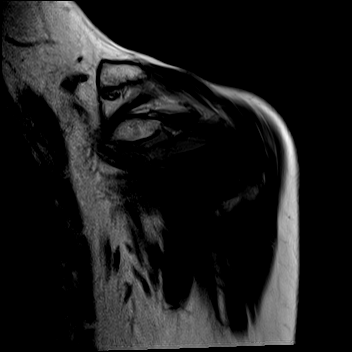
[im 8/19]
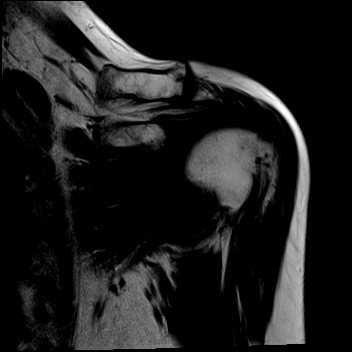
[im 11/19]
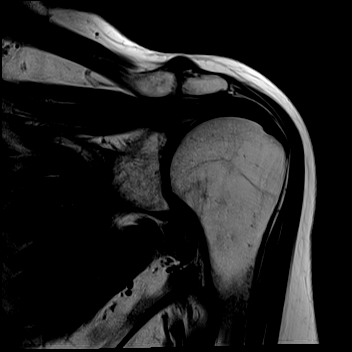
[im 13/19]
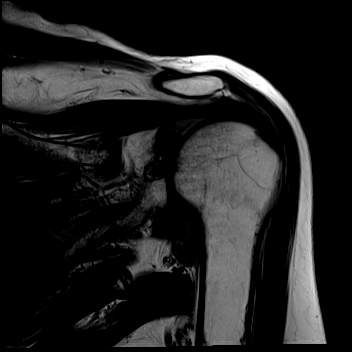
[im 16/19]
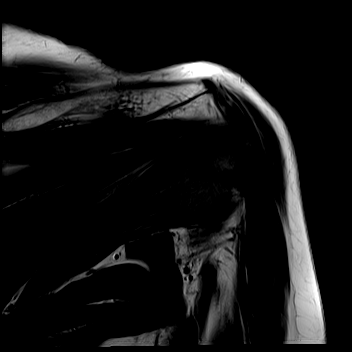
[im 19/19]
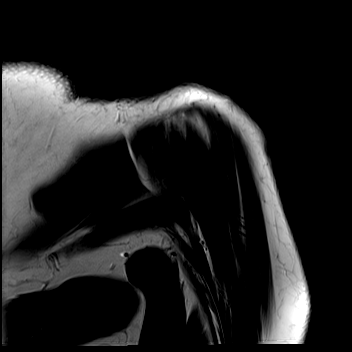

[40 of 40 positions shown; findings below may reference images not displayed]

FINDINGS: Rotator cuff: Intact. There is heterogeneously increased T2 signal
in the rotator cuff tendons consistent with tendinopathy.

Muscles:  Normal without atrophy or focal lesion.

Biceps long head:  Intact.

Acromioclavicular Joint: Mild-to-moderate osteoarthritis. Type 1
acromion. No subacromial/subdeltoid bursal fluid.

Glenohumeral Joint: The patient has glenohumeral osteoarthritis with
cartilage thinning and a subchondral cyst in the inferior glenoid.
Small glenohumeral joint effusion is identified. A T1 and T2
hypointense fragment in the axillary recess measuring 0.4 cm is
compatible with a loose body.

Labrum:  The labrum is markedly degenerated.

Bones:  No fracture, contusion or worrisome lesion.

Other: None.
IMPRESSION: Rotator cuff tendinopathy without tear.

Mild to moderate acromioclavicular and moderate glenohumeral
osteoarthritis. 0.4 cm loose body in the axillary recess noted.

Markedly degenerated labrum.

## 2021-10-12 DIAGNOSIS — E669 Obesity, unspecified: Secondary | ICD-10-CM | POA: Diagnosis not present

## 2021-10-12 DIAGNOSIS — F419 Anxiety disorder, unspecified: Secondary | ICD-10-CM | POA: Diagnosis not present

## 2021-10-12 DIAGNOSIS — I10 Essential (primary) hypertension: Secondary | ICD-10-CM | POA: Diagnosis not present

## 2021-10-12 DIAGNOSIS — Z6823 Body mass index (BMI) 23.0-23.9, adult: Secondary | ICD-10-CM | POA: Diagnosis not present

## 2021-10-12 DIAGNOSIS — G40309 Generalized idiopathic epilepsy and epileptic syndromes, not intractable, without status epilepticus: Secondary | ICD-10-CM | POA: Diagnosis not present

## 2021-10-12 DIAGNOSIS — E039 Hypothyroidism, unspecified: Secondary | ICD-10-CM | POA: Diagnosis not present

## 2021-10-12 DIAGNOSIS — F17200 Nicotine dependence, unspecified, uncomplicated: Secondary | ICD-10-CM | POA: Diagnosis not present

## 2021-10-12 DIAGNOSIS — I2510 Atherosclerotic heart disease of native coronary artery without angina pectoris: Secondary | ICD-10-CM | POA: Diagnosis not present

## 2021-10-12 DIAGNOSIS — E782 Mixed hyperlipidemia: Secondary | ICD-10-CM | POA: Diagnosis not present

## 2021-10-12 DIAGNOSIS — E559 Vitamin D deficiency, unspecified: Secondary | ICD-10-CM | POA: Diagnosis not present

## 2021-10-24 ENCOUNTER — Other Ambulatory Visit: Payer: Self-pay | Admitting: Family Medicine

## 2021-10-25 ENCOUNTER — Other Ambulatory Visit: Payer: Self-pay | Admitting: Family Medicine

## 2021-10-25 DIAGNOSIS — Z139 Encounter for screening, unspecified: Secondary | ICD-10-CM

## 2021-10-29 ENCOUNTER — Other Ambulatory Visit: Payer: Self-pay

## 2021-10-29 ENCOUNTER — Ambulatory Visit
Admission: RE | Admit: 2021-10-29 | Discharge: 2021-10-29 | Disposition: A | Discharge status: Home / Self Care | Payer: Medicare HMO | Source: Ambulatory Visit | Attending: Family Medicine | Admitting: Family Medicine

## 2021-10-29 DIAGNOSIS — E039 Hypothyroidism, unspecified: Secondary | ICD-10-CM | POA: Diagnosis not present

## 2021-10-29 DIAGNOSIS — Z1231 Encounter for screening mammogram for malignant neoplasm of breast: Secondary | ICD-10-CM | POA: Diagnosis not present

## 2021-10-29 DIAGNOSIS — Z139 Encounter for screening, unspecified: Secondary | ICD-10-CM

## 2021-10-29 DIAGNOSIS — I10 Essential (primary) hypertension: Secondary | ICD-10-CM | POA: Diagnosis not present

## 2021-10-29 DIAGNOSIS — J449 Chronic obstructive pulmonary disease, unspecified: Secondary | ICD-10-CM | POA: Diagnosis not present

## 2021-12-18 ENCOUNTER — Encounter (HOSPITAL_COMMUNITY): Payer: Self-pay | Admitting: Occupational Therapy

## 2021-12-18 NOTE — Therapy (Signed)
[place] °[facility] °[address] °[place], [name], [address] °Phone: [phone]   Fax:  [phone] ° °Patient Details  °Name: Erin Cooper °MRN: [id number] °Date of Birth: 04/11/1951 °Referring Provider:  No ref. provider found ° °Encounter Date: 12/18/2021 ° ° °OCCUPATIONAL THERAPY DISCHARGE SUMMARY ° °Visits from Start of Care: 4 ° °Current functional level related to goals / functional outcomes: °Unknown. Pt did not return for therapy after last visit on 06/06/21.  °  °Remaining deficits: °Unknown.  °  °Education / Equipment: °HEP  ° °Patient agrees to discharge. Patient goals were not met. Patient is being discharged due to not returning since the last visit.. ° ° ° ° °Leslie Troxler, OTR/L  °[phone] °12/18/2021, 2:53 PM ° °[name] °[facility] °[address] °[place], [place], [address] °Phone: [phone]   Fax:  [phone] °

## 2022-01-29 DIAGNOSIS — J449 Chronic obstructive pulmonary disease, unspecified: Secondary | ICD-10-CM | POA: Diagnosis not present

## 2022-01-29 DIAGNOSIS — E039 Hypothyroidism, unspecified: Secondary | ICD-10-CM | POA: Diagnosis not present

## 2022-01-29 DIAGNOSIS — I10 Essential (primary) hypertension: Secondary | ICD-10-CM | POA: Diagnosis not present

## 2022-04-28 DIAGNOSIS — J449 Chronic obstructive pulmonary disease, unspecified: Secondary | ICD-10-CM | POA: Diagnosis not present

## 2022-04-28 DIAGNOSIS — E039 Hypothyroidism, unspecified: Secondary | ICD-10-CM | POA: Diagnosis not present

## 2022-04-28 DIAGNOSIS — I10 Essential (primary) hypertension: Secondary | ICD-10-CM | POA: Diagnosis not present

## 2022-04-30 ENCOUNTER — Ambulatory Visit
Admission: EM | Admit: 2022-04-30 | Discharge: 2022-04-30 | Disposition: A | Discharge status: Home / Self Care | Payer: Medicare HMO | Attending: Family Medicine | Admitting: Family Medicine

## 2022-04-30 DIAGNOSIS — L02214 Cutaneous abscess of groin: Secondary | ICD-10-CM

## 2022-04-30 MED ORDER — DOXYCYCLINE HYCLATE 100 MG PO CAPS: 100.0000 mg | ORAL_CAPSULE | Freq: Two times a day (BID) | ORAL | 0 refills | Status: AC

## 2022-04-30 NOTE — ED Provider Notes (Signed)
?[facility] ? ? ? ?CSN: [id number] ?Arrival date & time: 04/30/22  0841 ? ? ?  ? ?History   ?Chief Complaint ?Chief Complaint  ?Patient presents with  ? Abscess  ? ? ?HPI ?Erin Cooper is a 71 y.o. female.  ? ?Presenting today with about 2-week history of a right groin abscess that has been draining and somewhat improving over the course of time.  States that she has been doing warm compresses, Neosporin and keeping it covered with a gauze pad and this seems to be helping temporarily.  She denies fever, chills, increasing in size, worsening pain.  Does have a history of MRSA diagnosed 5 years ago so wanted to come get the area checked out. ? ? ?Past Medical History:  ?Diagnosis Date  ? Cancer [facility])   ? Uterine  ? Chronic back pain   ? Chronic neck pain   ? COPD (chronic obstructive pulmonary disease) ([place])   ? Coronary atherosclerosis of native coronary artery   ? Possible diagnosis - details not complete  ? Essential hypertension, benign   ? History of MRSA infection   ? History of uterine cancer   ? Age 4  ? Seizures ([place])   ? ? ?Patient Active Problem List  ? Diagnosis Date Noted  ? Syncope 04/20/2014  ? HYPOTHYROIDISM 05/29/2010  ? Essential hypertension, benign 10/31/2009  ? CORONARY ATHEROSCLEROSIS NATIVE CORONARY ARTERY 10/31/2009  ? ? ?Past Surgical History:  ?Procedure Laterality Date  ? APPENDECTOMY  2000  ? BACK SURGERY    ? x2  ? CHOLECYSTECTOMY  2001  ? COLONOSCOPY N/A 05/12/2015  ? Procedure: COLONOSCOPY;  Surgeon: Rogene Houston, MD;  [facility];  Service: Endoscopy;  Laterality: N/A;  1220 - moved to 10:25 - Ann to notify pt  ? NECK SURGERY    ? x2  ? PARTIAL HYSTERECTOMY    ? Age 79, uterine cancer  ? THYMECTOMY    ? 11/06/2009, Dr Servando Snare   ? THYROID SURGERY    ? TUMOR EXCISION    ? Chest  ? ? ?OB History   ?No obstetric history on file. ?  ? ? ? ?Home Medications   ? ?Prior to Admission medications   ?Medication Sig Start Date End Date Taking? Authorizing Provider   ?doxycycline (VIBRAMYCIN) 100 MG capsule Take 1 capsule (100 mg total) by mouth 2 (two) times daily. 04/30/22  Yes Volney American, PA-C  ?albuterol (PROVENTIL) (2.5 MG/3ML) 0.083% nebulizer solution Take 2.5 mg by nebulization as needed for wheezing or shortness of breath.    [provider]  ?amitriptyline (ELAVIL) 25 MG tablet Take 1-2 tablets by mouth at bedtime. 07/28/19   [provider]  ?amLODipine (NORVASC) 2.5 MG tablet Take 2.5 mg by mouth every morning. 01/20/21   [provider]  ?aspirin EC 81 MG tablet Take 81 mg by mouth daily.    [provider]  ?diazepam (VALIUM) 5 MG tablet Take 1 tablet by mouth 3 (three) times daily as needed for muscle spasms or anxiety. 07/09/19   [provider]  ?losartan (COZAAR) 100 MG tablet Take 100 mg by mouth daily.  03/24/14   [provider]  ?meloxicam (MOBIC) 7.5 MG tablet Take 7.5 mg by mouth daily. 03/07/21   [provider]  ?oxcarbazepine (TRILEPTAL) 600 MG tablet Take 300 mg by mouth 2 (two) times daily.    [provider]  ?SYNTHROID 88 MCG tablet Take 88 mcg by mouth daily before  breakfast.  04/26/13   [provider]  ? ? ?Family History ?Family History  ?Problem Relation Age of Onset  ? Lung cancer Mother   ? Heart failure Father   ? Cirrhosis Father   ? Breast cancer Daughter   ?     in her 5s  ? ? ?Social History ?Social History  ? ?Tobacco Use  ? Smoking status: Every Day  ?  Packs/day: 0.50  ?  Types: Cigarettes  ?  Start date: 03/05/1974  ? Smokeless tobacco: Never  ? Tobacco comments:  ?  1/2 pack a day since age 63  ?Vaping Use  ? Vaping Use: Never used  ?Substance Use Topics  ? Alcohol use: No  ?  Alcohol/week: 0.0 standard drinks  ? Drug use: No  ? ? ? ?Allergies   ?Levothyroxine sodium, Morphine, and Penicillins ? ? ?Review of Systems ?Review of Systems ?Per HPI ? ?Physical Exam ?Triage Vital Signs ?ED Triage Vitals  ?Enc Vitals Group  ?   BP 04/30/22 1013 (!) 164/82   ?   Pulse Rate 04/30/22 1013 70  ?   Resp 04/30/22 1013 18  ?   Temp 04/30/22 1013 98 ?F (36.7 ?C)  ?   Temp Source 04/30/22 1013 Oral  ?   SpO2 04/30/22 1013 96 %  ?   Weight --   ?   Height --   ?   Head Circumference --   ?   Peak Flow --   ?   Pain Score 04/30/22 1017 2  ?   Pain Loc --   ?   Pain Edu? --   ?   Excl. in [place]? --   ? ?No data found. ? ?Updated Vital Signs ?BP (!) 164/82 (BP Location: Right Arm)   Pulse 70   Temp 98 ?F (36.7 ?C) (Oral)   Resp 18   SpO2 96%  ? ?Visual Acuity ?Right Eye Distance:   ?Left Eye Distance:   ?Bilateral Distance:   ? ?Right Eye Near:   ?Left Eye Near:    ?Bilateral Near:    ? ?Physical Exam ?Vitals and nursing note reviewed.  ?Constitutional:   ?   Appearance: Normal appearance. She is not ill-appearing.  ?HENT:  ?   Head: Atraumatic.  ?Eyes:  ?   Extraocular Movements: Extraocular movements intact.  ?   Conjunctiva/sclera: Conjunctivae normal.  ?Cardiovascular:  ?   Rate and Rhythm: Normal rate and regular rhythm.  ?   Heart sounds: Normal heart sounds.  ?Pulmonary:  ?   Effort: Pulmonary effort is normal.  ?   Breath sounds: Normal breath sounds.  ?Musculoskeletal:     ?   General: Normal range of motion.  ?   Cervical back: Normal range of motion and neck supple.  ?Skin: ?   General: Skin is warm.  ?   Comments: Small firm abscess with central opening to the right groin fold just laterally from the right labia no active drainage or bleeding.  No fluctuance or induration, minimal erythema  ?Neurological:  ?   Mental Status: She is alert and oriented to person, place, and time.  ?Psychiatric:     ?   Mood and Affect: Mood normal.     ?   Thought Content: Thought content normal.     ?   Judgment: Judgment normal.  ? ? ? ?UC Treatments / Results  ?Labs ?(all labs ordered are listed, but only abnormal results are displayed) ?Labs Reviewed -  No data to display ? ?EKG ? ? ?Radiology ?No results found. ? ?Procedures ?Procedures (including critical care time) ? ?Medications  Ordered in UC ?Medications - No data to display ? ?Initial Impression / Assessment and Plan / UC Course  ?I have reviewed the triage vital signs and the nursing notes. ? ?Pertinent labs & imaging results that were available during my care of the patient were reviewed by me and considered in my medical decision making (see chart for details). ? ? No indication for I&D procedure today, will start 10-day course of doxycycline and continue good home wound care with Epsom salt soaks, warm compresses, Neosporin, keeping covered, pain relievers as needed.  Return for any acutely worsening symptoms. ? ?Final Clinical Impressions(s) / UC Diagnoses  ? ?Final diagnoses:  ?Groin abscess  ? ?Discharge Instructions   ?None ?  ? ?ED Prescriptions   ? ? Medication Sig Dispense Auth. Provider  ? doxycycline (VIBRAMYCIN) 100 MG capsule Take 1 capsule (100 mg total) by mouth 2 (two) times daily. 20 capsule Volney American, [place]  ? ?  ? ?PDMP not reviewed this encounter. ?  ?Volney American, PA-C ?04/30/22 1043 ? ?

## 2022-04-30 NOTE — ED Triage Notes (Signed)
Pt states she an abscess in the crease of her leg in her groin on the left side for about 2 weeks ? ?Pt states she has been diagnosed with MRSA for 5 years ? ?Pt states she has tried Neosporin for a few days without much relief ? ?Denies Fever ?

## 2022-05-11 ENCOUNTER — Encounter (HOSPITAL_COMMUNITY): Payer: Self-pay | Admitting: Emergency Medicine

## 2022-05-11 ENCOUNTER — Emergency Department (HOSPITAL_COMMUNITY): Payer: Medicare HMO

## 2022-05-11 ENCOUNTER — Inpatient Hospital Stay (HOSPITAL_COMMUNITY): Payer: Medicare HMO

## 2022-05-11 ENCOUNTER — Encounter: Payer: Self-pay | Admitting: Emergency Medicine

## 2022-05-11 ENCOUNTER — Other Ambulatory Visit: Payer: Self-pay

## 2022-05-11 ENCOUNTER — Ambulatory Visit
Admission: EM | Admit: 2022-05-11 | Discharge: 2022-05-11 | Payer: Medicare HMO | Attending: Family Medicine | Admitting: Family Medicine

## 2022-05-11 DIAGNOSIS — I2510 Atherosclerotic heart disease of native coronary artery without angina pectoris: Secondary | ICD-10-CM | POA: Diagnosis not present

## 2022-05-11 DIAGNOSIS — R570 Cardiogenic shock: Secondary | ICD-10-CM | POA: Diagnosis present

## 2022-05-11 DIAGNOSIS — E860 Dehydration: Secondary | ICD-10-CM | POA: Diagnosis not present

## 2022-05-11 DIAGNOSIS — N179 Acute kidney failure, unspecified: Secondary | ICD-10-CM | POA: Diagnosis not present

## 2022-05-11 DIAGNOSIS — I471 Supraventricular tachycardia: Secondary | ICD-10-CM | POA: Diagnosis present

## 2022-05-11 DIAGNOSIS — Z515 Encounter for palliative care: Secondary | ICD-10-CM | POA: Diagnosis not present

## 2022-05-11 DIAGNOSIS — I468 Cardiac arrest due to other underlying condition: Secondary | ICD-10-CM | POA: Diagnosis not present

## 2022-05-11 DIAGNOSIS — K7200 Acute and subacute hepatic failure without coma: Secondary | ICD-10-CM | POA: Diagnosis present

## 2022-05-11 DIAGNOSIS — I2609 Other pulmonary embolism with acute cor pulmonale: Secondary | ICD-10-CM

## 2022-05-11 DIAGNOSIS — Z801 Family history of malignant neoplasm of trachea, bronchus and lung: Secondary | ICD-10-CM | POA: Diagnosis not present

## 2022-05-11 DIAGNOSIS — I959 Hypotension, unspecified: Secondary | ICD-10-CM | POA: Diagnosis not present

## 2022-05-11 DIAGNOSIS — I2781 Cor pulmonale (chronic): Secondary | ICD-10-CM | POA: Diagnosis present

## 2022-05-11 DIAGNOSIS — J9601 Acute respiratory failure with hypoxia: Secondary | ICD-10-CM | POA: Diagnosis present

## 2022-05-11 DIAGNOSIS — E872 Acidosis, unspecified: Secondary | ICD-10-CM | POA: Diagnosis not present

## 2022-05-11 DIAGNOSIS — I498 Other specified cardiac arrhythmias: Secondary | ICD-10-CM

## 2022-05-11 DIAGNOSIS — E871 Hypo-osmolality and hyponatremia: Secondary | ICD-10-CM | POA: Diagnosis present

## 2022-05-11 DIAGNOSIS — R0689 Other abnormalities of breathing: Secondary | ICD-10-CM | POA: Diagnosis not present

## 2022-05-11 DIAGNOSIS — Z803 Family history of malignant neoplasm of breast: Secondary | ICD-10-CM

## 2022-05-11 DIAGNOSIS — R0602 Shortness of breath: Secondary | ICD-10-CM | POA: Diagnosis not present

## 2022-05-11 DIAGNOSIS — F17210 Nicotine dependence, cigarettes, uncomplicated: Secondary | ICD-10-CM | POA: Diagnosis present

## 2022-05-11 DIAGNOSIS — R579 Shock, unspecified: Secondary | ICD-10-CM

## 2022-05-11 DIAGNOSIS — E039 Hypothyroidism, unspecified: Secondary | ICD-10-CM | POA: Diagnosis present

## 2022-05-11 DIAGNOSIS — Z452 Encounter for adjustment and management of vascular access device: Secondary | ICD-10-CM | POA: Diagnosis not present

## 2022-05-11 DIAGNOSIS — I50810 Right heart failure, unspecified: Secondary | ICD-10-CM | POA: Diagnosis not present

## 2022-05-11 DIAGNOSIS — M549 Dorsalgia, unspecified: Secondary | ICD-10-CM | POA: Diagnosis not present

## 2022-05-11 DIAGNOSIS — I50813 Acute on chronic right heart failure: Secondary | ICD-10-CM | POA: Diagnosis present

## 2022-05-11 DIAGNOSIS — I130 Hypertensive heart and chronic kidney disease with heart failure and stage 1 through stage 4 chronic kidney disease, or unspecified chronic kidney disease: Secondary | ICD-10-CM | POA: Diagnosis present

## 2022-05-11 DIAGNOSIS — R531 Weakness: Secondary | ICD-10-CM | POA: Diagnosis not present

## 2022-05-11 DIAGNOSIS — J90 Pleural effusion, not elsewhere classified: Secondary | ICD-10-CM | POA: Diagnosis not present

## 2022-05-11 DIAGNOSIS — Z8249 Family history of ischemic heart disease and other diseases of the circulatory system: Secondary | ICD-10-CM

## 2022-05-11 DIAGNOSIS — I492 Junctional premature depolarization: Secondary | ICD-10-CM | POA: Diagnosis present

## 2022-05-11 DIAGNOSIS — I700 Atherosclerosis of aorta: Secondary | ICD-10-CM | POA: Diagnosis present

## 2022-05-11 DIAGNOSIS — Z9049 Acquired absence of other specified parts of digestive tract: Secondary | ICD-10-CM

## 2022-05-11 DIAGNOSIS — I898 Other specified noninfective disorders of lymphatic vessels and lymph nodes: Secondary | ICD-10-CM | POA: Diagnosis not present

## 2022-05-11 DIAGNOSIS — N184 Chronic kidney disease, stage 4 (severe): Secondary | ICD-10-CM | POA: Diagnosis not present

## 2022-05-11 DIAGNOSIS — K761 Chronic passive congestion of liver: Secondary | ICD-10-CM | POA: Diagnosis present

## 2022-05-11 DIAGNOSIS — R918 Other nonspecific abnormal finding of lung field: Secondary | ICD-10-CM | POA: Diagnosis not present

## 2022-05-11 DIAGNOSIS — I214 Non-ST elevation (NSTEMI) myocardial infarction: Principal | ICD-10-CM | POA: Diagnosis present

## 2022-05-11 DIAGNOSIS — Z66 Do not resuscitate: Secondary | ICD-10-CM | POA: Diagnosis not present

## 2022-05-11 DIAGNOSIS — S0990XA Unspecified injury of head, initial encounter: Secondary | ICD-10-CM | POA: Diagnosis not present

## 2022-05-11 DIAGNOSIS — R55 Syncope and collapse: Secondary | ICD-10-CM

## 2022-05-11 DIAGNOSIS — J8410 Pulmonary fibrosis, unspecified: Secondary | ICD-10-CM | POA: Diagnosis not present

## 2022-05-11 DIAGNOSIS — I10 Essential (primary) hypertension: Secondary | ICD-10-CM | POA: Diagnosis not present

## 2022-05-11 DIAGNOSIS — J439 Emphysema, unspecified: Secondary | ICD-10-CM | POA: Diagnosis present

## 2022-05-11 DIAGNOSIS — Z4682 Encounter for fitting and adjustment of non-vascular catheter: Secondary | ICD-10-CM | POA: Diagnosis not present

## 2022-05-11 DIAGNOSIS — Z8542 Personal history of malignant neoplasm of other parts of uterus: Secondary | ICD-10-CM

## 2022-05-11 DIAGNOSIS — J811 Chronic pulmonary edema: Secondary | ICD-10-CM | POA: Diagnosis not present

## 2022-05-11 DIAGNOSIS — Z90711 Acquired absence of uterus with remaining cervical stump: Secondary | ICD-10-CM

## 2022-05-11 DIAGNOSIS — I6529 Occlusion and stenosis of unspecified carotid artery: Secondary | ICD-10-CM | POA: Diagnosis not present

## 2022-05-11 DIAGNOSIS — R0902 Hypoxemia: Secondary | ICD-10-CM | POA: Diagnosis not present

## 2022-05-11 DIAGNOSIS — Z4659 Encounter for fitting and adjustment of other gastrointestinal appliance and device: Secondary | ICD-10-CM | POA: Diagnosis not present

## 2022-05-11 LAB — I-STAT CHEM 8, ED
BUN: 22 mg/dL (ref 8–23)
Calcium, Ion: 1 mmol/L — ABNORMAL LOW (ref 1.15–1.40)
Chloride: 100 mmol/L (ref 98–111)
Creatinine, Ser: 1.8 mg/dL — ABNORMAL HIGH (ref 0.44–1.00)
Glucose, Bld: 139 mg/dL — ABNORMAL HIGH (ref 70–99)
HCT: 38 % (ref 36.0–46.0)
Hemoglobin: 12.9 g/dL (ref 12.0–15.0)
Potassium: 3.8 mmol/L (ref 3.5–5.1)
Sodium: 131 mmol/L — ABNORMAL LOW (ref 135–145)
TCO2: 17 mmol/L — ABNORMAL LOW (ref 22–32)

## 2022-05-11 LAB — TYPE AND SCREEN
ABO/RH(D): O POS
ABO/RH(D): O POS
Antibody Screen: NEGATIVE
Antibody Screen: NEGATIVE

## 2022-05-11 LAB — CBC
HCT: 36.7 % (ref 36.0–46.0)
Hemoglobin: 12.4 g/dL (ref 12.0–15.0)
MCH: 33.7 pg (ref 26.0–34.0)
MCHC: 33.8 g/dL (ref 30.0–36.0)
MCV: 99.7 fL (ref 80.0–100.0)
Platelets: 199 10*3/uL (ref 150–400)
RBC: 3.68 MIL/uL — ABNORMAL LOW (ref 3.87–5.11)
RDW: 13.9 % (ref 11.5–15.5)
WBC: 10.5 10*3/uL (ref 4.0–10.5)
nRBC: 0 % (ref 0.0–0.2)

## 2022-05-11 LAB — COOXEMETRY PANEL
Carboxyhemoglobin: 0.9 % (ref 0.5–1.5)
Methemoglobin: <0.7 % (ref 0.0–1.5)
O2 Saturation: 45.6 %
Total hemoglobin: 12.6 g/dL (ref 12.0–16.0)

## 2022-05-11 LAB — TSH: TSH: 3.112 u[IU]/mL (ref 0.350–4.500)

## 2022-05-11 LAB — LACTIC ACID, PLASMA
Lactic Acid, Venous: 6.6 mmol/L (ref 0.5–1.9)
Lactic Acid, Venous: 8.7 mmol/L (ref 0.5–1.9)
Lactic Acid, Venous: 5.9 mmol/L (ref 0.5–1.9)

## 2022-05-11 LAB — ECHOCARDIOGRAM COMPLETE
AR max vel: 2.68 cm2
AV Area VTI: 2.1 cm2
AV Area mean vel: 2.56 cm2
AV Mean grad: 5 mmHg
AV Peak grad: 9.9 mmHg
Ao pk vel: 1.57 m/s
Area-P 1/2: 3.03 cm2
Height: 64 in
S' Lateral: 1.9 cm
Weight: 2240 oz

## 2022-05-11 LAB — COMPREHENSIVE METABOLIC PANEL
ALT: 65 U/L — ABNORMAL HIGH (ref 0–44)
AST: 111 U/L — ABNORMAL HIGH (ref 15–41)
Albumin: 3.7 g/dL (ref 3.5–5.0)
Alkaline Phosphatase: 64 U/L (ref 38–126)
Anion gap: 13 (ref 5–15)
BUN: 22 mg/dL (ref 8–23)
CO2: 18 mmol/L — ABNORMAL LOW (ref 22–32)
Calcium: 8.3 mg/dL — ABNORMAL LOW (ref 8.9–10.3)
Chloride: 101 mmol/L (ref 98–111)
Creatinine, Ser: 1.87 mg/dL — ABNORMAL HIGH (ref 0.44–1.00)
GFR, Estimated: 28 mL/min — ABNORMAL LOW (ref >60)
Glucose, Bld: 152 mg/dL — ABNORMAL HIGH (ref 70–99)
Potassium: 3.7 mmol/L (ref 3.5–5.1)
Sodium: 132 mmol/L — ABNORMAL LOW (ref 135–145)
Total Bilirubin: 0.6 mg/dL (ref 0.3–1.2)
Total Protein: 6 g/dL — ABNORMAL LOW (ref 6.5–8.1)

## 2022-05-11 LAB — TROPONIN I (HIGH SENSITIVITY): Troponin I (High Sensitivity): >24000 ng/L (ref <18)

## 2022-05-11 LAB — LIPASE, BLOOD: Lipase: 31 U/L (ref 11–51)

## 2022-05-11 LAB — BRAIN NATRIURETIC PEPTIDE: B Natriuretic Peptide: 237 pg/mL — ABNORMAL HIGH (ref 0.0–100.0)

## 2022-05-11 LAB — POCT FASTING CBG KUC MANUAL ENTRY: POCT Glucose (KUC): 151 mg/dL — AB (ref 70–99)

## 2022-05-11 LAB — MRSA NEXT GEN BY PCR, NASAL: MRSA by PCR Next Gen: NOT DETECTED

## 2022-05-11 LAB — HEPARIN LEVEL (UNFRACTIONATED): Heparin Unfractionated: 0.35 IU/mL (ref 0.30–0.70)

## 2022-05-11 MED ORDER — SODIUM CHLORIDE 0.9 % IV SOLN
250.0000 mL | INTRAVENOUS | Status: DC
  Administered 2022-05-11: 250 mL via INTRAVENOUS

## 2022-05-11 MED ORDER — HEPARIN (PORCINE) 25000 UT/250ML-% IV SOLN
1000.0000 [IU]/h | INTRAVENOUS | Status: DC
  Administered 2022-05-11: 700 [IU]/h via INTRAVENOUS
  Filled 2022-05-11 (×3): qty 250

## 2022-05-11 MED ORDER — METRONIDAZOLE 500 MG/100ML IV SOLN
500.0000 mg | Freq: Once | INTRAVENOUS | Status: AC
  Administered 2022-05-11: 500 mg via INTRAVENOUS
  Filled 2022-05-11: qty 100

## 2022-05-11 MED ORDER — SODIUM CHLORIDE 0.9 % IV SOLN
2.0000 g | INTRAVENOUS | Status: DC
  Administered 2022-05-12: 2 g via INTRAVENOUS
  Filled 2022-05-11: qty 12.5

## 2022-05-11 MED ORDER — VANCOMYCIN HCL 750 MG/150ML IV SOLN
750.0000 mg | INTRAVENOUS | Status: DC
  Filled 2022-05-11: qty 150

## 2022-05-11 MED ORDER — NOREPINEPHRINE 4 MG/250ML-% IV SOLN
2.0000 ug/min | INTRAVENOUS | Status: DC
  Administered 2022-05-11: 10 ug/min via INTRAVENOUS
  Administered 2022-05-11: 2 ug/min via INTRAVENOUS
  Filled 2022-05-11 (×3): qty 250

## 2022-05-11 MED ORDER — DOCUSATE SODIUM 100 MG PO CAPS: 100.0000 mg | ORAL_CAPSULE | Freq: Two times a day (BID) | ORAL | Status: DC | PRN

## 2022-05-11 MED ORDER — PERFLUTREN LIPID MICROSPHERE
1.0000 mL | INTRAVENOUS | Status: AC | PRN
  Administered 2022-05-11: 2 mL via INTRAVENOUS
  Filled 2022-05-11: qty 10

## 2022-05-11 MED ORDER — CHLORHEXIDINE GLUCONATE CLOTH 2 % EX PADS
6.0000 | MEDICATED_PAD | Freq: Every day | CUTANEOUS | Status: DC
  Administered 2022-05-11: 6 via TOPICAL

## 2022-05-11 MED ORDER — VANCOMYCIN HCL IN DEXTROSE 1-5 GM/200ML-% IV SOLN
1000.0000 mg | Freq: Once | INTRAVENOUS | Status: AC
  Administered 2022-05-11: 1000 mg via INTRAVENOUS
  Filled 2022-05-11: qty 200

## 2022-05-11 MED ORDER — SODIUM CHLORIDE 0.9 % IV BOLUS (SEPSIS): 1000.0000 mL | Freq: Once | INTRAVENOUS | Status: DC

## 2022-05-11 MED ORDER — IOHEXOL 350 MG/ML SOLN
75.0000 mL | Freq: Once | INTRAVENOUS | Status: AC | PRN
  Administered 2022-05-11: 75 mL via INTRAVENOUS

## 2022-05-11 MED ORDER — HEPARIN BOLUS VIA INFUSION
4000.0000 [IU] | Freq: Once | INTRAVENOUS | Status: AC
  Administered 2022-05-11: 4000 [IU] via INTRAVENOUS

## 2022-05-11 MED ORDER — POLYETHYLENE GLYCOL 3350 17 G PO PACK: 17.0000 g | PACK | Freq: Every day | ORAL | Status: DC | PRN

## 2022-05-11 MED ORDER — ASPIRIN 325 MG PO TABS
325.0000 mg | ORAL_TABLET | Freq: Once | ORAL | Status: AC
  Administered 2022-05-11: 325 mg via ORAL
  Filled 2022-05-11: qty 1

## 2022-05-11 MED ORDER — SODIUM CHLORIDE 0.9 % IV BOLUS (SEPSIS)
1000.0000 mL | Freq: Once | INTRAVENOUS | Status: DC
  Administered 2022-05-11: 1000 mL via INTRAVENOUS

## 2022-05-11 MED ORDER — NOREPINEPHRINE 4 MG/250ML-% IV SOLN
0.0000 ug/min | INTRAVENOUS | Status: DC
  Administered 2022-05-12: 25 ug/min via INTRAVENOUS
  Filled 2022-05-11 (×3): qty 250

## 2022-05-11 MED ORDER — ONDANSETRON HCL 4 MG/2ML IJ SOLN
4.0000 mg | Freq: Four times a day (QID) | INTRAMUSCULAR | Status: DC | PRN
  Administered 2022-05-11 (×2): 4 mg via INTRAVENOUS
  Filled 2022-05-11 (×3): qty 2

## 2022-05-11 MED ORDER — SODIUM CHLORIDE 0.9 % IV SOLN
2.0000 g | Freq: Once | INTRAVENOUS | Status: AC
  Administered 2022-05-11: 2 g via INTRAVENOUS
  Filled 2022-05-11: qty 12.5

## 2022-05-11 MED ORDER — MELATONIN 3 MG PO TABS
3.0000 mg | ORAL_TABLET | Freq: Every day | ORAL | Status: DC
  Administered 2022-05-11: 3 mg via ORAL
  Filled 2022-05-11: qty 1

## 2022-05-11 MED ORDER — SODIUM CHLORIDE 0.9 % IV BOLUS
1000.0000 mL | Freq: Once | INTRAVENOUS | Status: AC
  Administered 2022-05-11: 1000 mL via INTRAVENOUS

## 2022-05-11 MED ORDER — DOBUTAMINE IN D5W 4-5 MG/ML-% IV SOLN
2.5000 ug/kg/min | INTRAVENOUS | Status: DC
  Administered 2022-05-11: 2.5 ug/kg/min via INTRAVENOUS
  Filled 2022-05-11: qty 250

## 2022-05-11 NOTE — ED Triage Notes (Signed)
Pt arrived [facility] from urgent care c/o hypotension. Per EMS, BP 74/50. Pt is N/V. Back pain--Hx of multiple back surgery's.   ?

## 2022-05-11 NOTE — ED Triage Notes (Signed)
States she was working out side yesterday and felt like she was going to pass out, went inside and states she passed out. States legs were weak.  States she has no strength and is nauseated.  States she passed out twice.   ?

## 2022-05-11 NOTE — Sepsis Progress Note (Signed)
Sepsis protocol is being followed by [facility]. 

## 2022-05-11 NOTE — H&P (Addendum)
? ?NAME:  DENIS KOPPEL, MRN:  [id number], DOB:  04-18-1951, LOS: 0 ?ADMISSION DATE:  05/28/2022, CONSULTATION DATE:  05/29/2022 ?REFERRING MD:  Tomi Bamberger, CHIEF COMPLAINT:  syncope  ? ?History of Present Illness:  ?71yF with history of uterine cancer, COPD, CAD, HTN, seizures, hypothyroid who presented to [facility] ED for multiple syncope episodes, nausea, weakness, episodic dyspnea. Wonders if it was related to heat exposure yesterday.  ? ?Seen last week for R groin abscess and completed a course of doxy, thought it was improving ? ?At [facility] ED found to be in shock, suspected cardiogenic but concern for septic as well with SSTI vs unknown source, NSTEMI. Given 2L NS, ASA 325, started on vanc/cefepime/flagyl, levophed, heparin gtt, cardiology consulted. ? ?Pertinent  Medical History  ?Uterine cancer ?COPD ?CAD ?HTN ?Seizures ?Hypothyroid ? ?Significant Hospital Events: ?Including procedures, antibiotic start and stop dates in addition to other pertinent events   ?5/13 admitted ? ?Interim History / Subjective:  ? ? ?Objective   ?Blood pressure 109/70, pulse 63, temperature 98.6 ?F (37 ?C), temperature source Rectal, resp. rate 18, height '5\' 4"'$  (1.626 m), weight 63.5 kg, SpO2 (!) 87 %. ?   ?   ?No intake or output data in the 24 hours ending 05/08/2022 1305 ?Filed Weights  ? 05/21/2022 0905  ?Weight: 63.5 kg  ? ? ?Examination: ?General appearance: 71 y.o., female, NAD, conversant  ?Eyes: anicteric sclerae; PERRL, tracking appropriately ?HENT: NCAT; MMM ?Neck: Trachea midline; no lymphadenopathy, no JVD ?Lungs: CTAB, no crackles, no wheeze, with normal respiratory effort ?CV: RRR, no murmur  ?Abdomen: Soft, non-tender; non-distended, BS present  ?Extremities: No peripheral edema, warm ?Skin: Normal turgor and texture; no rash ?Psych: Appropriate affect ?Neuro: Alert and oriented to person and place, no focal deficit  ? ? ?Resolved Hospital Problem list   ? ?Assessment & Plan:  ? ?# Shock: Reviewed bedside TTE with cardiology with distended  RV/septal flattening. Previous echo in distant past with RVSP of 51, mod TR. Concern is either massive PE, RCA infarct superimposed on progressive PH/RV failure, progressive PH now decompensated in setting small PE burden (but trop elevation seems out of proportion to this). Lower suspicion for sepsis - no clear source. ?- wean levo for MAP 65 ?- CTA Chest - feel immediate diagnostic benefit outweighs theoretical risk of kidney injury, discussed with patient and she consents to CTA. ?- continue heparin gtt ?- 48h rule out at most of vanc/cefepime ? ?# NSTEMI  ?As above alternatively PE.  ?- TTE ?- s/p ASA 325, continue heparin gtt ?- cardiology consulted ? ?# AKI ?- check CK ?- f/u response to initial resuscitative measures ? ?# Lactic acidosis ?- trend ?- CTM response to resuscitative measures above ? ?# Transaminitis ?- trend LFTs ? ?Best Practice (right click and "Reselect all SmartList Selections" daily)  ? ?Diet/type: NPO ?DVT prophylaxis: systemic heparin ?GI prophylaxis: N/A ?Lines: Central line ?Foley:  N/A ?Code Status:  full code ?Last date of multidisciplinary goals of care discussion [Pt updated at bedside] ? ?Labs   ?CBC: ?Recent Labs  ?Lab 05/25/2022 ?0912 05/08/2022 ?0932  ?WBC 10.5  --   ?HGB 12.4 12.9  ?HCT 36.7 38.0  ?MCV 99.7  --   ?PLT 199  --   ? ? ?Basic Metabolic Panel: ?Recent Labs  ?Lab 05/23/2022 ?0912 05/10/2022 ?0932  ?NA 132* 131*  ?K 3.7 3.8  ?CL 101 100  ?CO2 18*  --   ?GLUCOSE 152* 139*  ?BUN 22 22  ?CREATININE 1.87* 1.80*  ?  CALCIUM 8.3*  --   ? ?GFR: ?Estimated Creatinine Clearance: 24 mL/min (A) (by C-G formula based on SCr of 1.8 mg/dL (H)). ?Recent Labs  ?Lab 05/29/2022 ?0912 05/21/2022 ?1108  ?WBC 10.5  --   ?LATICACIDVEN 6.6* 8.7*  ? ? ?Liver Function Tests: ?Recent Labs  ?Lab 05/12/2022 ?0912  ?AST 111*  ?ALT 65*  ?ALKPHOS 64  ?BILITOT 0.6  ?PROT 6.0*  ?ALBUMIN 3.7  ? ?Recent Labs  ?Lab 05/01/2022 ?1108  ?LIPASE 31  ? ?No results for input(s): AMMONIA in the last 168 hours. ? ?ABG ?    ?Component Value Date/Time  ? PHART 7.383 11/07/2009 0400  ? PCO2ART 51.6 (H) 11/07/2009 0400  ? PO2ART 72.0 (L) 11/07/2009 0400  ? HCO3 30.7 (H) 11/07/2009 0400  ? TCO2 17 (L) 05/01/2022 0932  ? O2SAT 93.0 11/07/2009 0400  ?  ? ?Coagulation Profile: ?No results for input(s): INR, PROTIME in the last 168 hours. ? ?Cardiac Enzymes: ?No results for input(s): CKTOTAL, CKMB, CKMBINDEX, TROPONINI in the last 168 hours. ? ?HbA1C: ?No results found for: HGBA1C ? ?CBG: ?No results for input(s): GLUCAP in the last 168 hours. ? ?Review of Systems:   ?12 point review of systems is negative except as in HPI ? ?Past Medical History:  ?She,  has a past medical history of Cancer ([place]), Chronic back pain, Chronic neck pain, COPD (chronic obstructive pulmonary disease) ([place]), Coronary atherosclerosis of native coronary artery, Essential hypertension, benign, History of MRSA infection, History of uterine cancer, and Seizures ([place]).  ? ?Surgical History:  ? ?Past Surgical History:  ?Procedure Laterality Date  ? APPENDECTOMY  2000  ? BACK SURGERY    ? x2  ? CHOLECYSTECTOMY  2001  ? COLONOSCOPY N/A 05/12/2015  ? Procedure: COLONOSCOPY;  Surgeon: Rogene Houston, MD;  [facility];  Service: Endoscopy;  Laterality: N/A;  1220 - moved to 10:25 - Ann to notify pt  ? NECK SURGERY    ? x2  ? PARTIAL HYSTERECTOMY    ? Age 21, uterine cancer  ? THYMECTOMY    ? 11/06/2009, Dr Servando Snare   ? THYROID SURGERY    ? TUMOR EXCISION    ? Chest  ?  ? ?Social History:  ? reports that she has been smoking cigarettes. She started smoking about 48 years ago. She has been smoking an average of .5 packs per day. She has never used smokeless tobacco. She reports that she does not drink alcohol and does not use drugs.  ? ?Family History:  ?Her family history includes Breast cancer in her daughter; Cirrhosis in her father; Heart failure in her father; Lung cancer in her mother.  ? ?Allergies ?Allergies  ?Allergen Reactions  ? Levothyroxine Sodium Other  (See Comments)  ?  Generic synthroid caused altered mental   ? Morphine   ? Penicillins   ?  ? ?Home Medications  ?Prior to Admission medications   ?Medication Sig Start Date End Date Taking? Authorizing Provider  ?albuterol (PROVENTIL) (2.5 MG/3ML) 0.083% nebulizer solution Take 2.5 mg by nebulization as needed for wheezing or shortness of breath.    [provider]  ?amitriptyline (ELAVIL) 25 MG tablet Take 1-2 tablets by mouth at bedtime. 07/28/19   [provider]  ?amLODipine (NORVASC) 2.5 MG tablet Take 2.5 mg by mouth every morning. 01/20/21   [provider]  ?aspirin EC 81 MG tablet Take 81 mg by mouth daily.    [provider]  ?diazepam (VALIUM) 5 MG tablet Take  1 tablet by mouth 3 (three) times daily as needed for muscle spasms or anxiety. 07/09/19   [provider]  ?doxycycline (VIBRAMYCIN) 100 MG capsule Take 1 capsule (100 mg total) by mouth 2 (two) times daily. 04/30/22   Volney American, PA-C  ?losartan (COZAAR) 100 MG tablet Take 100 mg by mouth daily.  03/24/14   [provider]  ?meloxicam (MOBIC) 7.5 MG tablet Take 7.5 mg by mouth daily. 03/07/21   [provider]  ?oxcarbazepine (TRILEPTAL) 600 MG tablet Take 300 mg by mouth 2 (two) times daily.    [provider]  ?SYNTHROID 88 MCG tablet Take 88 mcg by mouth daily before breakfast.  04/26/13   [provider]  ?  ? ?Critical care time: 38 minutes ?  ? ? ? ?  ?

## 2022-05-11 NOTE — Progress Notes (Signed)
Pharmacy Antibiotic Note ? ?Erin Cooper a 71 y.o. female admitted on 05/14/2022 with sepsis.  Pharmacy has been consulted for vancomycin and cefepime dosing. ? ?Plan: ?Vancomycin '750mg'$  IV every 24 hours.  Goal trough 15-20 mcg/mL. ?Cefepime 2gm IV every 24 hours. ? ?Medical History: ?Past Medical History:  ?Diagnosis Date  ? Cancer [facility])   ? Uterine  ? Chronic back pain   ? Chronic neck pain   ? COPD (chronic obstructive pulmonary disease) ([place])   ? Coronary atherosclerosis of native coronary artery   ? Possible diagnosis - details not complete  ? Essential hypertension, benign   ? History of MRSA infection   ? History of uterine cancer   ? Age 68  ? Seizures ([place])   ? ? ?Allergies:  ?Allergies  ?Allergen Reactions  ? Levothyroxine Sodium Other (See Comments)  ?  Generic synthroid caused altered mental   ? Morphine   ? Penicillins   ? ? ?Filed Weights  ? 05/15/2022 0905  ?Weight: 63.5 kg (140 lb)  ? ? ? ?  Latest Ref Rng & Units 05/26/2022  ?  9:32 AM 05/18/2022  ?  9:12 AM 03/16/2021  ? 10:05 AM  ?CBC  ?WBC 4.0 - 10.5 K/uL  10.5   8.3    ?Hemoglobin 12.0 - 15.0 g/dL 12.9   12.4   14.4    ?Hematocrit 36.0 - 46.0 % 38.0   36.7   42.5    ?Platelets 150 - 400 K/uL  199   267    ? ? ? ?Estimated Creatinine Clearance: 24 mL/min (A) (by C-G formula based on SCr of 1.8 mg/dL (H)). ? ?Antibiotics Given (last 72 hours)   ? ? Date/Time Action Medication Dose Rate  ? 05/24/2022 1042 New Bag/Given  ? ceFEPIme (MAXIPIME) 2 g in sodium chloride 0.9 % 100 mL IVPB 2 g 200 mL/hr  ? 05/01/2022 1054 New Bag/Given  ? metroNIDAZOLE (FLAGYL) IVPB 500 mg 500 mg 100 mL/hr  ? 05/12/2022 1110 New Bag/Given  ? vancomycin (VANCOCIN) IVPB 1000 mg/200 mL premix 1,000 mg 200 mL/hr  ? ?  ? ? ?Antimicrobials this admission: ? ?Cefepime 05/27/2022  >>  ?vancomycin 05/29/2022  >>  ?Metronidazole 05/24/2022   x 1  ? ?Microbiology results: ?04/30/2022  BCx: sent ?05/01/2022  UCx: sent ?05/02/2022  Resp Panel: sent  ?05/21/2022  MRSA PCR: sent ? ?Thank you for allowing  pharmacy to be a part of this patient?s care. ? ?Thomasenia Sales, PharmD ?Clinical Pharmacist ? ? ? ?

## 2022-05-11 NOTE — Progress Notes (Signed)
ANTICOAGULATION CONSULT NOTE - Initial Consult ? ?Pharmacy Consult for heparin gtt  ?Indication: chest pain/ACS ? ?Allergies  ?Allergen Reactions  ? Levothyroxine Sodium Other (See Comments)  ?  Generic synthroid caused altered mental   ? Morphine   ? Penicillins   ? ? ?Patient Measurements: ?Height: '5\' 4"'$  (162.6 cm) ?Weight: 63.5 kg (140 lb) ?IBW/kg (Calculated) : 54.7 ?Heparin Dosing Weight: HEPARIN DW (KG): 63.5 ? ? ?Vital Signs: ?Temp: 98.6 ?F (37 ?C) (05/13 9417) ?Temp Source: Rectal (05/13 4081) ?BP: 90/51 (05/13 1050) ?Pulse Rate: 67 (05/13 1050) ? ?Labs: ?Recent Labs  ?  05/14/2022 ?0912 05/07/2022 ?0932  ?HGB 12.4 12.9  ?HCT 36.7 38.0  ?PLT 199  --   ?CREATININE 1.87* 1.80*  ? ? ?Estimated Creatinine Clearance: 24 mL/min (A) (by C-G formula based on SCr of 1.8 mg/dL (H)). ? ? ?Medical History: ?Past Medical History:  ?Diagnosis Date  ? Cancer [facility])   ? Uterine  ? Chronic back pain   ? Chronic neck pain   ? COPD (chronic obstructive pulmonary disease) ([place])   ? Coronary atherosclerosis of native coronary artery   ? Possible diagnosis - details not complete  ? Essential hypertension, benign   ? History of MRSA infection   ? History of uterine cancer   ? Age 71  ? Seizures ([place])   ? ? ?Medications:  ?(Not in a hospital admission)  ?Scheduled:  ? aspirin  325 mg Oral Once  ? heparin  4,000 Units Intravenous Once  ? ?Infusions:  ? sodium chloride    ? ceFEPime (MAXIPIME) IV 2 g (05/04/2022 1042)  ? heparin    ? metronidazole 500 mg (05/07/2022 1054)  ? norepinephrine (LEVOPHED) Adult infusion 8 mcg/min (05/18/2022 1101)  ? sodium chloride 1,000 mL (05/29/2022 1047)  ? And  ? sodium chloride    ? vancomycin    ? ?PRN:  ?Anti-infectives (From admission, onward)  ? ? Start     Dose/Rate Route Frequency Ordered Stop  ? 05/16/2022 1030  ceFEPIme (MAXIPIME) 2 g in sodium chloride 0.9 % 100 mL IVPB       ? 2 g ?200 mL/hr over 30 Minutes Intravenous  Once 05/03/2022 1020    ? 05/16/2022 1030  metroNIDAZOLE (FLAGYL) IVPB 500 mg       ?  500 mg ?100 mL/hr over 60 Minutes Intravenous  Once 05/26/2022 1020    ? 04/30/2022 1030  vancomycin (VANCOCIN) IVPB 1000 mg/200 mL premix       ? 1,000 mg ?200 mL/hr over 60 Minutes Intravenous  Once 05/24/2022 1020    ? ?  ? ? ?Assessment: ?Erin Cooper a 71 y.o. female requires anticoagulation with a heparin iv infusion for the indication of  chest pain/ACS. Heparin gtt will be started following pharmacy protocol per pharmacy consult. Patient is not on previous oral anticoagulant that will require aPTT/HL correlation before transitioning to only HL monitoring.  ? ?Goal of Therapy:  ?Heparin level 0.3-0.7 units/ml ?Monitor platelets by anticoagulation protocol: Yes ?  ?Plan:  ?Give 4000 units bolus x 1 ?Start heparin infusion at 700 units/hr ?Check anti-Xa level in 8 hours and daily while on heparin ?Continue to monitor H&H and platelets ? ?Donna Christen Banesa Tristan ?05/19/2022,11:07 AM ? ? ?

## 2022-05-11 NOTE — Sepsis Progress Note (Signed)
Spoke with bedside nurse regarding need for 3rd lactic acid per sepsis protocol since the 2nd lactic acid was higher than the first.  ?

## 2022-05-11 NOTE — ED Notes (Signed)
EMS here for transport

## 2022-05-11 NOTE — Progress Notes (Addendum)
ANTICOAGULATION CONSULT NOTE ? ?Pharmacy Consult for heparin gtt  ?Indication: chest pain/ACS ? ?Allergies  ?Allergen Reactions  ? Codeine Shortness Of Breath  ? Morphine Shortness Of Breath  ? Penicillins Shortness Of Breath  ? Levothyroxine Sodium Other (See Comments)  ?  Generic synthroid caused altered mental   ? ? ?Patient Measurements: ?Height: '5\' 4"'$  (162.6 cm) ?Weight: 63.5 kg (140 lb) ?IBW/kg (Calculated) : 54.7 ?Heparin Dosing Weight: HEPARIN DW (KG): 63.5 ? ? ?Vital Signs: ?Temp: 97.7 ?F (36.5 ?C) (05/13 1400) ?Temp Source: Oral (05/13 1400) ?BP: 114/103 (05/13 1830) ?Pulse Rate: 69 (05/13 1830) ? ?Labs: ?Recent Labs  ?  05/12/2022 ?0912 04/30/2022 ?0932 05/17/2022 ?1856  ?HGB 12.4 12.9  --   ?HCT 36.7 38.0  --   ?PLT 199  --   --   ?HEPARINUNFRC  --   --  0.35  ?CREATININE 1.87* 1.80*  --   ? ? ? ?Estimated Creatinine Clearance: 24 mL/min (A) (by C-G formula based on SCr of 1.8 mg/dL (H)). ? ? ?Medical History: ?Past Medical History:  ?Diagnosis Date  ? Cancer [facility])   ? Uterine  ? Chronic back pain   ? Chronic neck pain   ? COPD (chronic obstructive pulmonary disease) ([place])   ? Coronary atherosclerosis of native coronary artery   ? Possible diagnosis - details not complete  ? Essential hypertension, benign   ? History of MRSA infection   ? History of uterine cancer   ? Age 18  ? Seizures ([place])   ? ? ?Medications:  ?Medications Prior to Admission  ?Medication Sig Dispense Refill Last Dose  ? albuterol (PROVENTIL) (2.5 MG/3ML) 0.083% nebulizer solution Take 2.5 mg by nebulization as needed for wheezing or shortness of breath.   unk  ? amitriptyline (ELAVIL) 25 MG tablet Take 1-2 tablets by mouth at bedtime.   Past Week  ? amLODipine (NORVASC) 2.5 MG tablet Take 2.5 mg by mouth every morning.   05/21/2022  ? aspirin EC 81 MG tablet Take 81 mg by mouth daily.   Past Week  ? diazepam (VALIUM) 5 MG tablet Take 1 tablet by mouth 3 (three) times daily as needed for muscle spasms or anxiety.   unk  ? latanoprost  (XALATAN) 0.005 % ophthalmic solution Place 1 drop into both eyes at bedtime.   Past Week  ? losartan (COZAAR) 100 MG tablet Take 100 mg by mouth daily.    05/19/2022  ? naproxen (NAPROSYN) 250 MG tablet Take 250 mg by mouth 2 (two) times daily as needed for mild pain or headache.   Past Month  ? oxcarbazepine (TRILEPTAL) 600 MG tablet Take 300 mg by mouth 2 (two) times daily.   05/25/2022  ? SYNTHROID 88 MCG tablet Take 88 mcg by mouth daily before breakfast.    05/04/2022  ? VITAMIN D PO Take 2,000 Units by mouth daily.   Past Week  ? doxycycline (VIBRAMYCIN) 100 MG capsule Take 1 capsule (100 mg total) by mouth 2 (two) times daily. (Patient not taking: Reported on 05/10/2022) 20 capsule 0 Completed Course  ? meloxicam (MOBIC) 7.5 MG tablet Take 7.5 mg by mouth daily. (Patient not taking: Reported on 04/30/2022)   Not Taking  ? ?Scheduled:  ? [START ON 05/12/2022] Chlorhexidine Gluconate Cloth  6 each Topical Q0600  ? ?Infusions:  ? sodium chloride 10 mL/hr at 05/29/2022 1800  ? [START ON 05/12/2022] ceFEPime (MAXIPIME) IV    ? DOBUTamine 2.5 mcg/kg/min (05/17/2022 1800)  ? heparin 700 Units/hr (05/20/2022  1800)  ? norepinephrine (LEVOPHED) Adult infusion 9 mcg/min (05/23/2022 1800)  ? ?PRN:  ?Anti-infectives (From admission, onward)  ? ? Start     Dose/Rate Route Frequency Ordered Stop  ? 05/12/22 1100  vancomycin (VANCOREADY) IVPB 750 mg/150 mL  Status:  Discontinued       ? 750 mg ?150 mL/hr over 60 Minutes Intravenous Every 24 hours 05/29/2022 1238 04/29/2022 1848  ? 05/12/22 1000  ceFEPIme (MAXIPIME) 2 g in sodium chloride 0.9 % 100 mL IVPB       ? 2 g ?200 mL/hr over 30 Minutes Intravenous Every 24 hours 05/27/2022 1238    ? 05/22/2022 1030  ceFEPIme (MAXIPIME) 2 g in sodium chloride 0.9 % 100 mL IVPB       ? 2 g ?200 mL/hr over 30 Minutes Intravenous  Once 05/06/2022 1020 05/18/2022 1224  ? 05/03/2022 1030  metroNIDAZOLE (FLAGYL) IVPB 500 mg       ? 500 mg ?100 mL/hr over 60 Minutes Intravenous  Once 05/26/2022 1020 05/02/2022 1231  ?  05/12/2022 1030  vancomycin (VANCOCIN) IVPB 1000 mg/200 mL premix       ? 1,000 mg ?200 mL/hr over 60 Minutes Intravenous  Once 05/27/2022 1020 05/25/2022 1224  ? ?  ? ? ?Assessment: ?Erin Cooper a 71 y.o. female requires anticoagulation with a heparin iv infusion for the indication of  chest pain/ACS. No anticoagulants noted PTA ?-heparin level at goal on 700 units/hr ? ?Goal of Therapy:  ?Heparin level 0.3-0.7 units/ml ?Monitor platelets by anticoagulation protocol: Yes ?  ?Plan:  ?-Continue heparin at 700 units/hr ?-Daily heparin level and CBC ? ?Hildred Laser, PharmD ?Clinical Pharmacist ?**Pharmacist phone directory can now be found on [email or web address] ([email or web address]).  Listed under [place]. ? ? ? ? ?

## 2022-05-11 NOTE — ED Notes (Signed)
MD at bedside. 

## 2022-05-11 NOTE — Progress Notes (Signed)
?  Echocardiogram ?2D Echocardiogram has been performed. ? ?Erin Cooper ?05/27/2022, 2:49 PM ?

## 2022-05-11 NOTE — Consult Note (Addendum)
?Cardiology Consultation:  ? ?Patient ID: Erin Cooper ?MRN: [id number]; DOB: February 24, 1951 ? ?Admit date: 05/24/2022 ?Date of Consult: 05/15/2022 ? ?PCP:  Erin Sites, MD ?  ?[facility] ?Cardiologist:  Erin Dolly, MD   { ? ?Patient Profile:  ? ?Erin Cooper is a 71 y.o. female with a hx of coronary calcifications (noted on prior CT with low-risk NST in 03/2021), HTN, COPD, history of thymic mediastinal mass (s/p partial sternotomy) and history of uterine cancer who is being seen 05/19/2022 for the evaluation of NSTEMI at the request of Erin. Tomi Cooper. ? ?History of Present Illness:  ? ?Erin Cooper was last examined by Erin Cooper in 02/2022 following a recent Emergency Department visit for chest pain during which her pain was overall felt to be atypical for a cardiac etiology as it was worse with movement of her left arm and not associated with exertion. She did report worsening dyspnea on exertion, therefore a Lexiscan Myoview was obtained and showed no evidence of ischemia and was a low-risk study. ? ?She initially presented to [facility] this morning for evaluation of syncopal episodes which started the day prior to arrival and she also reported intermittent weakness and nausea. She was found to be hypoxic and also hypotensive with BP at 82/58. EMS was called for transport to [name]. BP was at 63/52 while in the ED.  ? ?Initial labs showed WBC 10.5, Hgb 12.4, platelets 199, Na+ 132, K+ 3.7 creatinine 1.87 (baseline 0.7 - 0.9).  AST 111 and ALT 65.  Initial lactic acid 6.6 with repeat of 8.7.  CXR with no acute cardiopulmonary disease. CT Abdomen showed no acute findings in the chest, abdomen or pelvis. Was noted to have aortic atherosclerosis and coronary calcifications. ? ?She was started on Cefepime, Flagyl and Vancomycin. Also started on Levophed and received 2 L IV fluids and was transferred to [facility] for further evaluation.  ? ?In talking with the patient today, she reports not  feeling well for the past week.  She initially started to experience presyncopal episodes 6 days prior to admission but reports symptoms temporarily improved in the interim. Yesterday, she had actual syncopal episodes while walking around her home. Reports that she felt weak in her legs but that was her only symptom. Denies any associated chest pain or palpitations. No recent orthopnea, PND or pitting edema. She does report having intermittent discomfort along her legs. She also has chronic dyspnea on exertion in the setting of COPD.  No recent fever or chills. Notes  that she did have a right groin abscess noted last week and was started on antibiotic therapy but this has resolved in the interim. ? ? ?Past Medical History:  ?Diagnosis Date  ? Cancer [facility])   ? Uterine  ? Chronic back pain   ? Chronic neck pain   ? COPD (chronic obstructive pulmonary disease) ([place])   ? Coronary atherosclerosis of native coronary artery   ? Possible diagnosis - details not complete  ? Essential hypertension, benign   ? History of MRSA infection   ? History of uterine cancer   ? Age 13  ? Seizures ([place])   ? ? ?Past Surgical History:  ?Procedure Laterality Date  ? APPENDECTOMY  2000  ? BACK SURGERY    ? x2  ? CHOLECYSTECTOMY  2001  ? COLONOSCOPY N/A 05/12/2015  ? Procedure: COLONOSCOPY;  Surgeon: Erin Houston, MD;  Location: [facility];  Service: Endoscopy;  Laterality: N/A;  1220 -  moved to 10:25 - Ann to notify pt  ? NECK SURGERY    ? x2  ? PARTIAL HYSTERECTOMY    ? Age 74, uterine cancer  ? THYMECTOMY    ? 11/06/2009, Erin Cooper   ? THYROID SURGERY    ? TUMOR EXCISION    ? Chest  ?  ? ?Home Medications:  ?Prior to Admission medications   ?Medication Sig Start Date End Date Taking? Authorizing Provider  ?albuterol (PROVENTIL) (2.5 MG/3ML) 0.083% nebulizer solution Take 2.5 mg by nebulization as needed for wheezing or shortness of breath.    [provider]  ?amitriptyline (ELAVIL) 25 MG tablet Take 1-2 tablets by mouth  at bedtime. 07/28/19   [provider]  ?amLODipine (NORVASC) 2.5 MG tablet Take 2.5 mg by mouth every morning. 01/20/21   [provider]  ?aspirin EC 81 MG tablet Take 81 mg by mouth daily.    [provider]  ?diazepam (VALIUM) 5 MG tablet Take 1 tablet by mouth 3 (three) times daily as needed for muscle spasms or anxiety. 07/09/19   [provider]  ?doxycycline (VIBRAMYCIN) 100 MG capsule Take 1 capsule (100 mg total) by mouth 2 (two) times daily. 04/30/22   Erin American, PA-C  ?losartan (COZAAR) 100 MG tablet Take 100 mg by mouth daily.  03/24/14   [provider]  ?meloxicam (MOBIC) 7.5 MG tablet Take 7.5 mg by mouth daily. 03/07/21   [provider]  ?oxcarbazepine (TRILEPTAL) 600 MG tablet Take 300 mg by mouth 2 (two) times daily.    [provider]  ?SYNTHROID 88 MCG tablet Take 88 mcg by mouth daily before breakfast.  04/26/13   [provider]  ? ? ?Inpatient Medications: ?Scheduled Meds: ? ?Continuous Infusions: ? sodium chloride    ? heparin 700 Units/hr (05/26/2022 1202)  ? norepinephrine (LEVOPHED) Adult infusion 8 mcg/min (05/09/2022 1101)  ? vancomycin 1,000 mg (05/08/2022 1110)  ? ?PRN Meds: ? ? ?Allergies:    ?Allergies  ?Allergen Reactions  ? Levothyroxine Sodium Other (See Comments)  ?  Generic synthroid caused altered mental   ? Morphine   ? Penicillins   ? ? ?Social History:   ?Social History  ? ?Socioeconomic History  ? Marital status: Widowed  ?  Spouse name: Not on file  ? Number of children: Not on file  ? Years of education: Not on file  ? Highest education level: Not on file  ?Occupational History  ? Occupation: Disabled  ?Tobacco Use  ? Smoking status: Every Day  ?  Packs/day: 0.50  ?  Types: Cigarettes  ?  Start date: 03/05/1974  ? Smokeless tobacco: Never  ? Tobacco comments:  ?  1/2 pack a day since age 18  ?Vaping Use  ? Vaping Use: Never used  ?Substance and Sexual Activity  ? Alcohol use: No  ?  Alcohol/week: 0.0  standard drinks  ? Drug use: No  ? Sexual activity: Not Currently  ?  Birth control/protection: None  ?Other Topics Concern  ? Not on file  ?Social History Narrative  ? Married with 3 children  ? ?Social Determinants of Health  ? ?Financial Resource Strain: Not on file  ?Food Insecurity: Not on file  ?Transportation Needs: Not on file  ?Physical Activity: Not on file  ?Stress: Not on file  ?Social Connections: Not on file  ?Intimate Partner Violence: Not on file  ?  ?Family History:   ? ?Family History  ?Problem Relation Age of Onset  ?  Lung cancer Mother   ? Heart failure Father   ? Cirrhosis Father   ? Breast cancer Daughter   ?     in her 59s  ?  ? ?ROS:  ?Please see the history of present illness.  ? ?All other ROS reviewed and negative.    ? ?Physical Exam/Data:  ? ?Vitals:  ? 05/23/2022 1000 05/25/2022 1015 05/10/2022 1050 05/10/2022 1115  ?BP: 109/90 (!) 144/59 (!) 90/51 (!) 87/77  ?Pulse: 61 61 67 65  ?Resp: 20 (!) '26 18 16  '$ ?Temp:      ?TempSrc:      ?SpO2: 97% 100% (!) 88% 95%  ?Weight:      ?Height:      ? ?No intake or output data in the 24 hours ending 05/16/2022 1209 ? ?  05/14/2022  ?  9:05 AM 06/05/2021  ?  8:36 AM 05/15/2021  ?  8:38 AM  ?Last 3 Weights  ?Weight (lbs) 140 lb 140 lb 143 lb 3.2 oz  ?Weight (kg) 63.504 kg 63.504 kg 64.955 kg  ?   ?Body mass index is 24.03 kg/m?.  ?General: Ill-appearing ?HEENT: normal ?Neck: no JVD ?Vascular: No carotid bruits; Distal pulses 2+ bilaterally ?Cardiac:  normal S1, S2; RRR; no murmur  ?Lungs: Diminished breath sounds bilaterally ?Abd: soft, nontender, no hepatomegaly  ?Ext: no edema ?Musculoskeletal:  No deformities, BUE and BLE strength normal and equal ?Skin: warm and dry  ?Neuro:  CNs 2-12 intact, no focal abnormalities noted ?Psych:  Normal affect  ? ?EKG:  The EKG was personally reviewed and demonstrates: Junctional rhythm heart rate 61, likely sinus node arrest in the setting of lactic acidosis ?Telemetry:  Telemetry was personally reviewed and demonstrates: Sinus  rhythm in the 60 ? ?Relevant CV Studies: ? ?NST: 03/2021 ?There was no ST segment deviation noted during stress. ?The study is normal. There are no perfusion defects ?This is a low risk study. ?The left

## 2022-05-11 NOTE — Procedures (Addendum)
Central Venous Catheter Insertion Procedure Note ? ?Erin Cooper  ?[id number]  ?13-Jun-1951 ? ?Date:05/02/2022  ?Time:5:24 PM  ? ?Provider Performing:Kelsie Kramp  ? ?Procedure: Insertion of Non-tunneled Central Venous Catheter(36556) with US guidance (23953)  ? ?Indication(s) ?Medication administration ? ?Consent ?Risks of the procedure as well as the alternatives and risks of each were explained to the patient and/or caregiver.  Consent for the procedure was obtained and is signed in the bedside chart ? ?Anesthesia ?Topical only with 1% lidocaine  ? ?Timeout ?Verified patient identification, verified procedure, site/side was marked, verified correct patient position, special equipment/implants available, medications/allergies/relevant history reviewed, required imaging and test results available. ? ?Sterile Technique ?Maximal sterile technique including full sterile barrier drape, hand hygiene, sterile gown, sterile gloves, mask, hair covering, sterile ultrasound probe cover (if used). ? ?Procedure Description ?Area of catheter insertion was cleaned with chlorhexidine and draped in sterile fashion.  With real-time ultrasound guidance a central venous catheter was placed into the left subclavian vein. Nonpulsatile blood flow and easy flushing noted in all ports.  The catheter was sutured in place and sterile dressing applied. ? ?Complications/Tolerance ?None; patient tolerated the procedure well. ?Chest X-ray is ordered to verify placement for internal jugular or subclavian cannulation.   Chest x-ray is not ordered for femoral cannulation. ? ?EBL ?Minimal ? ?Specimen(s) ?None ? ? ? ? ?

## 2022-05-11 NOTE — ED Provider Notes (Signed)
?[facility] ?Provider Note ? ? ?CSN: [id number] ?Arrival date & time: 05/27/2022  0856 ? ?  ? ?History ? ?Chief Complaint  ?Patient presents with  ? Hypotension  ? ? ?Erin Cooper is a 71 y.o. female with past medical history significant for hypertension, COPD, chronic neck pain, back pain, history of multiple back surgeries, uterine cancer who presents with concern for hypotension, shortness of breath, with multiple episodes of syncope yesterday, some nausea and vomiting today.  She was sent over from urgent care with hypoxia of 87%, hypotension systolic of 75.  Patient denies any chest pain or feeling of heart racing.  She does endorse some chills, shortness of breath but denies fever.  She was being treated for groin infection with antibiotics which she reports that she just finished.  She feels that the groin abscess was significantly improving, and has not noted any purulent drainage in the last few days. ? ?HPI ? ?  ? ?Home Medications ?Prior to Admission medications   ?Medication Sig Start Date End Date Taking? Authorizing Provider  ?albuterol (PROVENTIL) (2.5 MG/3ML) 0.083% nebulizer solution Take 2.5 mg by nebulization as needed for wheezing or shortness of breath.    [provider]  ?amitriptyline (ELAVIL) 25 MG tablet Take 1-2 tablets by mouth at bedtime. 07/28/19   [provider]  ?amLODipine (NORVASC) 2.5 MG tablet Take 2.5 mg by mouth every morning. 01/20/21   [provider]  ?aspirin EC 81 MG tablet Take 81 mg by mouth daily.    [provider]  ?diazepam (VALIUM) 5 MG tablet Take 1 tablet by mouth 3 (three) times daily as needed for muscle spasms or anxiety. 07/09/19   [provider]  ?doxycycline (VIBRAMYCIN) 100 MG capsule Take 1 capsule (100 mg total) by mouth 2 (two) times daily. 04/30/22   Volney American, PA-C  ?losartan (COZAAR) 100 MG tablet Take 100 mg by mouth daily.  03/24/14   [provider]  ?meloxicam (MOBIC)  7.5 MG tablet Take 7.5 mg by mouth daily. 03/07/21   [provider]  ?oxcarbazepine (TRILEPTAL) 600 MG tablet Take 300 mg by mouth 2 (two) times daily.    [provider]  ?SYNTHROID 88 MCG tablet Take 88 mcg by mouth daily before breakfast.  04/26/13   [provider]  ?   ? ?Allergies    ?Levothyroxine sodium, Morphine, and Penicillins   ? ?Review of Systems   ?Review of Systems  ?Neurological:  Positive for syncope and weakness.  ?All other systems reviewed and are negative. ? ?Physical Exam ?Updated Vital Signs ?BP 109/70   Pulse 63   Temp 98.6 ?F (37 ?C) (Rectal)   Resp 18   Ht '5\' 4"'$  (1.626 m)   Wt 63.5 kg   SpO2 (!) 87%   BMI 24.03 kg/m?  ?Physical Exam ?Vitals and nursing note reviewed.  ?Constitutional:   ?   General: She is not in acute distress. ?   Appearance: She is ill-appearing.  ?HENT:  ?   Head: Normocephalic and atraumatic.  ?Eyes:  ?   General:     ?   Right eye: No discharge.     ?   Left eye: No discharge.  ?Cardiovascular:  ?   Rate and Rhythm: Normal rate and regular rhythm.  ?   Heart sounds: No murmur heard. ?  No friction rub. No gallop.  ?Pulmonary:  ?   Effort: Pulmonary effort is normal.  ?   Breath  sounds: Normal breath sounds.  ?   Comments: Some decreased respiratory excursion secondary to effort, no significant accessory breath sounds noted ?Abdominal:  ?   General: Bowel sounds are normal.  ?   Palpations: Abdomen is soft.  ?Genitourinary: ?   Comments: Normal appearance of external genitalia with no evidence of worsening abscess, groin infection appears well treated ?Skin: ?   Capillary Refill: Capillary refill takes less than 2 seconds.  ?   Coloration: Skin is pale.  ?   Comments: Pale, somewhat clammy  ?Neurological:  ?   Mental Status: She is alert and oriented to person, place, and time.  ?   Comments: Patient is alert and oriented x3, but somewhat slow to answer questions secondary to weakness.  Moves all limbs spontaneously.  ?Psychiatric:      ?   Mood and Affect: Mood normal.     ?   Behavior: Behavior normal.  ? ? ?ED Results / Procedures / Treatments   ?Labs ?(all labs ordered are listed, but only abnormal results are displayed) ?Labs Reviewed  ?CBC - Abnormal; Notable for the following components:  ?    Result Value  ? RBC 3.68 (*)   ? All other components within normal limits  ?LACTIC ACID, PLASMA - Abnormal; Notable for the following components:  ? Lactic Acid, Venous 6.6 (*)   ? All other components within normal limits  ?LACTIC ACID, PLASMA - Abnormal; Notable for the following components:  ? Lactic Acid, Venous 8.7 (*)   ? All other components within normal limits  ?COMPREHENSIVE METABOLIC PANEL - Abnormal; Notable for the following components:  ? Sodium 132 (*)   ? CO2 18 (*)   ? Glucose, Bld 152 (*)   ? Creatinine, Ser 1.87 (*)   ? Calcium 8.3 (*)   ? Total Protein 6.0 (*)   ? AST 111 (*)   ? ALT 65 (*)   ? GFR, Estimated 28 (*)   ? All other components within normal limits  ?BRAIN NATRIURETIC PEPTIDE - Abnormal; Notable for the following components:  ? B Natriuretic Peptide 237.0 (*)   ? All other components within normal limits  ?I-STAT CHEM 8, ED - Abnormal; Notable for the following components:  ? Sodium 131 (*)   ? Creatinine, Ser 1.80 (*)   ? Glucose, Bld 139 (*)   ? Calcium, Ion 1.00 (*)   ? TCO2 17 (*)   ? All other components within normal limits  ?TROPONIN I (HIGH SENSITIVITY) - Abnormal; Notable for the following components:  ? Troponin I (High Sensitivity) >24,000 (*)   ? All other components within normal limits  ?CULTURE, BLOOD (ROUTINE X 2)  ?CULTURE, BLOOD (ROUTINE X 2)  ?TSH  ?LIPASE, BLOOD  ?URINALYSIS, ROUTINE W REFLEX MICROSCOPIC  ?HEPARIN LEVEL (UNFRACTIONATED)  ?POC OCCULT BLOOD, ED  ?I-STAT CHEM 8, ED  ?TYPE AND SCREEN  ? ? ?EKG ?EKG Interpretation ? ?Date/Time:  Saturday May 11 2022 09:52:39 EDT ?Ventricular Rate:  61 ?PR Interval:    ?QRS Duration: 134 ?QT Interval:  426 ?QTC Calculation: 430 ?R Axis:   49 ?Text  Interpretation: Junctional rhythm IVCD, consider atypical RBBB No significant change since last tracing Confirmed by Dorie Rank [phone]) on 05/12/2022 10:11:10 AM ? ?Radiology ?DG Chest 2 View ? ?Result Date: 05/21/2022 ?CLINICAL DATA:  States she was working outside yesterday and went inside and states she passed out. States legs were weak. States she has no strength and is nauseated. States she passed  out twice, denies CP, hx of COPD. EXAM: CHEST - 2 VIEW COMPARISON:  03/16/2021 FINDINGS: Three sternal wires project over the upper sternum, stable. Cardiac silhouette is mildly enlarged. No mediastinal or hilar masses. Bilateral prominent interstitial markings and lung hyperexpansion, stable. No evidence of pneumonia or pulmonary edema. No convincing pleural effusion and no pneumothorax. Skeletal structures are grossly intact. IMPRESSION: No acute cardiopulmonary disease. Electronically Signed   By: Lajean Manes M.D.   On: 05/21/2022 10:48  ? ?CT CHEST ABDOMEN PELVIS WO CONTRAST ? ?Result Date: 05/04/2022 ?CLINICAL DATA:  States she was working out side yesterday and felt like she was going to pass out, went inside and states she passed out. States legs were weak. States she has no strength and is nauseated. States she passed out twice.Originally ordered with contrast, but pt's current creat is 1.8 and GFR is 29 EXAM: CT CHEST, ABDOMEN AND PELVIS WITHOUT CONTRAST TECHNIQUE: Multidetector CT imaging of the chest, abdomen and pelvis was performed following the standard protocol without IV contrast. RADIATION DOSE REDUCTION: This exam was performed according to the departmental dose-optimization program which includes automated exposure control, adjustment of the mA and/or kV according to patient size and/or use of iterative reconstruction technique. COMPARISON:  03/16/2021. FINDINGS: CT CHEST FINDINGS Cardiovascular: Heart is mildly enlarged. Three-vessel coronary artery calcifications. No pericardial effusion. Great  vessels are normal in caliber. Aortic atherosclerotic calcifications, stable from the prior study. No displacement of the intimal calcifications to suggest a dissection. No evidence of a penetrating ulcer or intramur

## 2022-05-11 NOTE — ED Provider Notes (Signed)
Patient presented to the ED critically ill with hypotension and complaining of back pain nausea and vomiting ?Marland Kitchen  Patient seen in conjunction with PA Prosperi.  Patient states that she started having symptoms yesterday.  She was extremely weak.  She felt a little bit better this morning so she took her blood pressure medications. ?Clinical Course as of 05/24/2022 1110  ?Sat May 11, 2022  ?0932 Negative hemoccult, ?Groin wound does not appear infected, purulent [CP]  ?1010 BP improving with fluids and pressors.  Will continue to monitor closely ? [JK]  ?1020 Lactic acid elevated.  Evolving sepsis is a concern.  Will start on antibiotics.  Proceed with additional fluid resuscitation [JK]  ?1031 Troponin severely elevated.   [JK]  ?1050 Soft BP with MAP ~60 ?Multiple syncopal episodes over the last week to few days. No reports of chest pain, back pain, tightness, pressure ?Will need transfer to [facility] [CP]  ?  ?Clinical Course User Index ?[CP] Prosperi, Christian H, PA-C ?[JK] Dorie Rank, MD  ? ?Marland KitchenCentral Line ? ?Date/Time: 05/08/2022 11:56 AM ?Performed by: Dorie Rank, MD ?Authorized by: Dorie Rank, MD  ? ?Consent:  ?  Consent obtained:  Verbal ?  Consent given by:  Patient ?  Risks, benefits, and alternatives were discussed: yes   ?  Risks discussed:  Arterial puncture and incorrect placement ?  Alternatives discussed:  No treatment ?Universal protocol:  ?  Procedure explained and questions answered to patient or proxy's satisfaction: yes   ?  Immediately prior to procedure, a time out was called: yes   ?  Patient identity confirmed:  Verbally with patient ?Pre-procedure details:  ?  Indication(s): central venous access   ?  Hand hygiene: Hand hygiene performed prior to insertion   ?  Sterile barrier technique: All elements of maximal sterile technique followed   ?  Skin preparation:  Chlorhexidine ?  Skin preparation agent: Skin preparation agent completely dried prior to procedure   ?Sedation:  ?  Sedation type:   None ?Anesthesia:  ?  Anesthesia method:  Local infiltration ?  Local anesthetic:  Lidocaine 1% w/o epi ?Procedure details:  ?  Location:  R femoral ?  Patient position:  Supine ?  Procedural supplies:  Triple lumen ?  Ultrasound guidance: yes   ?  Ultrasound guidance timing: real time   ?  Sterile ultrasound techniques: Sterile gel and sterile probe covers were used   ?  Number of attempts:  1 ?  Successful placement: yes   ?Post-procedure details:  ?  Post-procedure:  Dressing applied and line sutured ?  Assessment:  Blood return through all ports and free fluid flow ?  Procedure completion:  Tolerated well, no immediate complications ? ? ?Medical Decision Making ?Patient presents critically ill with hypotension.  Afebrile.  Differential was broad and includes but not limited to sepsis, acute cardiac ischemia, acute GI bleeding, abdominal aortic aneurysm with hemorrhage, aortic dissection.  Initial brief bedside ultrasound does not show evidence of free fluid.  No obvious aneurysm noted on initial bedside ultrasound.  We will start fluid resuscitation and proceed with IV pressors. ? ?Problems Addressed: ?Junctional rhythm: complicated acute illness or injury ?Lactic acidosis: acute illness or injury that poses a threat to life or bodily functions ?NSTEMI (non-ST elevated myocardial infarction) [facility]): acute illness or injury that poses a threat to life or bodily functions ?Shock [facility]): acute illness or injury that poses a threat to life or bodily functions ? ?Amount and/or Complexity of Data  Reviewed ?Labs: ordered. Decision-making details documented in ED Course. ?Radiology: ordered and independent interpretation performed. ? ?Risk ?OTC drugs. ?Prescription drug management. ?Decision regarding hospitalization. ? ?Patient's laboratory tests suggest cardiogenic shock.  However evolving sepsis still a concern.  Patient has been started on empiric antibiotics and fluid resuscitation.  Her blood pressure is improved  with pressors.  CT scan images are pending.  We will confirm with radiology that she has no signs of acute hemorrhage.  Once confirmed will start on heparin and aspirin. ? ?No acute findings were noted on CT scan.  No signs of acute infection.  No fever.  Normal white count.  No signs of pneumonia.  At this time suspect cardiogenic shock as opposed to sepsis.  Lactic acid level is increasing but patient has had persistent hypotension despite pressors.  Suspect this is related to her cardiogenic shock and not infection.  She has been covered with antibiotics but will hold off on additional oral fluid resuscitation as she does.  Patient has been started on heparin and aspirin.  Plan admission to the intensive care unit with cardiology evaluation of [facility]. ? ?  ?Dorie Rank, MD ?05/24/2022 1158 ? ?

## 2022-05-11 NOTE — ED Provider Notes (Signed)
?[facility] ? ? ? ?CSN: [id number] ?Arrival date & time: 05/12/2022  0809 ? ? ?  ? ?History   ?Chief Complaint ?No chief complaint on file. ? ? ?HPI ?Erin Cooper is a 71 y.o. female.  ? ?Patient presenting today with 1 day history of multiple syncopal episodes, weakness, nausea, occasional shortness of breath.  She denies chest pain, visual change, vomiting, diarrhea, urinary symptoms, fever, chills.  States she was outside in the heat for probably too long yesterday and thinks that she is dehydrated.  Of note, she was seen last week for a groin abscess and completed a course of antibiotics, felt like this was resolving.  Her past medical history is significant for history of COPD, CAD, CVA, MI, hypertension, history of uterine cancer, history of seizures.  States she took all of her normal medications this morning, no recent medication changes. ? ? ?Past Medical History:  ?Diagnosis Date  ? Cancer [facility])   ? Uterine  ? Chronic back pain   ? Chronic neck pain   ? COPD (chronic obstructive pulmonary disease) ([place])   ? Coronary atherosclerosis of native coronary artery   ? Possible diagnosis - details not complete  ? Essential hypertension, benign   ? History of MRSA infection   ? History of uterine cancer   ? Age 76  ? Seizures ([place])   ? ? ?Patient Active Problem List  ? Diagnosis Date Noted  ? Syncope 04/20/2014  ? HYPOTHYROIDISM 05/29/2010  ? Essential hypertension, benign 10/31/2009  ? CORONARY ATHEROSCLEROSIS NATIVE CORONARY ARTERY 10/31/2009  ? ? ?Past Surgical History:  ?Procedure Laterality Date  ? APPENDECTOMY  2000  ? BACK SURGERY    ? x2  ? CHOLECYSTECTOMY  2001  ? COLONOSCOPY N/A 05/12/2015  ? Procedure: COLONOSCOPY;  Surgeon: Rogene Houston, MD;  Location: [facility];  Service: Endoscopy;  Laterality: N/A;  1220 - moved to 10:25 - Ann to notify pt  ? NECK SURGERY    ? x2  ? PARTIAL HYSTERECTOMY    ? Age 45, uterine cancer  ? THYMECTOMY    ? 11/06/2009, Dr Servando Snare   ? THYROID SURGERY    ?  TUMOR EXCISION    ? Chest  ? ? ?OB History   ?No obstetric history on file. ?  ? ? ? ?Home Medications   ? ?Prior to Admission medications   ?Medication Sig Start Date End Date Taking? Authorizing Provider  ?albuterol (PROVENTIL) (2.5 MG/3ML) 0.083% nebulizer solution Take 2.5 mg by nebulization as needed for wheezing or shortness of breath.    [provider]  ?amitriptyline (ELAVIL) 25 MG tablet Take 1-2 tablets by mouth at bedtime. 07/28/19   [provider]  ?amLODipine (NORVASC) 2.5 MG tablet Take 2.5 mg by mouth every morning. 01/20/21   [provider]  ?aspirin EC 81 MG tablet Take 81 mg by mouth daily.    [provider]  ?diazepam (VALIUM) 5 MG tablet Take 1 tablet by mouth 3 (three) times daily as needed for muscle spasms or anxiety. 07/09/19   [provider]  ?doxycycline (VIBRAMYCIN) 100 MG capsule Take 1 capsule (100 mg total) by mouth 2 (two) times daily. 04/30/22   Volney American, PA-C  ?losartan (COZAAR) 100 MG tablet Take 100 mg by mouth daily.  03/24/14   [provider]  ?meloxicam (MOBIC) 7.5 MG tablet Take 7.5 mg by mouth daily. 03/07/21   [provider]  ?oxcarbazepine (TRILEPTAL) 600 MG tablet Take  300 mg by mouth 2 (two) times daily.    [provider]  ?SYNTHROID 88 MCG tablet Take 88 mcg by mouth daily before breakfast.  04/26/13   [provider]  ? ? ?Family History ?Family History  ?Problem Relation Age of Onset  ? Lung cancer Mother   ? Heart failure Father   ? Cirrhosis Father   ? Breast cancer Daughter   ?     in her 86s  ? ? ?Social History ?Social History  ? ?Tobacco Use  ? Smoking status: Every Day  ?  Packs/day: 0.50  ?  Types: Cigarettes  ?  Start date: 03/05/1974  ? Smokeless tobacco: Never  ? Tobacco comments:  ?  1/2 pack a day since age 41  ?Vaping Use  ? Vaping Use: Never used  ?Substance Use Topics  ? Alcohol use: No  ?  Alcohol/week: 0.0 standard drinks  ? Drug use: No  ? ? ? ?Allergies    ?Levothyroxine sodium, Morphine, and Penicillins ? ? ?Review of Systems ?Review of Systems ?Per HPI ? ?Physical Exam ?Triage Vital Signs ?ED Triage Vitals  ?Enc Vitals Group  ?   BP 05/14/2022 0819 (!) 75/48  ?   Pulse Rate 05/02/2022 0819 71  ?   Resp 05/19/2022 0819 20  ?   Temp --   ?   Temp src --   ?   SpO2 05/12/2022 0819 (!) 87 %  ?   Weight --   ?   Height --   ?   Head Circumference --   ?   Peak Flow --   ?   Pain Score 05/05/2022 0818 9  ?   Pain Loc --   ?   Pain Edu? --   ?   Excl. in [place]? --   ? ?No data found. ? ?Updated Vital Signs ?BP (!) 82/58   Pulse (!) 44   Resp 20   SpO2 94%  ? ?Visual Acuity ?Right Eye Distance:   ?Left Eye Distance:   ?Bilateral Distance:   ? ?Right Eye Near:   ?Left Eye Near:    ?Bilateral Near:    ? ?Physical Exam ?Constitutional:   ?   Comments: Alert and appropriate, answering questions appropriately however appears weak  ?HENT:  ?   Head: Atraumatic.  ?   Mouth/Throat:  ?   Mouth: Mucous membranes are moist.  ?   Pharynx: Oropharynx is clear. No posterior oropharyngeal erythema.  ?Eyes:  ?   Extraocular Movements: Extraocular movements intact.  ?   Conjunctiva/sclera: Conjunctivae normal.  ?   Pupils: Pupils are equal, round, and reactive to light.  ?Cardiovascular:  ?   Rate and Rhythm: Normal rate.  ?Pulmonary:  ?   Effort: Pulmonary effort is normal.  ?   Breath sounds: No wheezing or rales.  ?Abdominal:  ?   General: Bowel sounds are normal. There is no distension.  ?   Palpations: Abdomen is soft.  ?   Tenderness: There is no abdominal tenderness. There is no guarding.  ?Musculoskeletal:  ?   Cervical back: Normal range of motion and neck supple.  ?   Comments: In wheelchair due to weakness, ambulatory at baseline  ?Skin: ?   General: Skin is warm and dry.  ?Neurological:  ?   Sensory: No sensory deficit.  ?   Motor: Weakness present.  ?   Coordination: Coordination normal.  ?Psychiatric:     ?   Mood and Affect:  Mood normal.     ?   Behavior: Behavior normal.     ?    Thought Content: Thought content normal.     ?   Judgment: Judgment normal.  ? ? ? ?UC Treatments / Results  ?Labs ?(all labs ordered are listed, but only abnormal results are displayed) ?Labs Reviewed  ?POCT FASTING CBG [facility] MANUAL ENTRY - Abnormal; Notable for the following components:  ?    Result Value  ? POCT Glucose ([facility]) 151 (*)   ? All other components within normal limits  ? ? ?EKG ? ? ?Radiology ?No results found. ? ?Procedures ?Procedures (including critical care time) ? ?Medications Ordered in UC ?Medications - No data to display ? ?Initial Impression / Assessment and Plan / UC Course  ?I have reviewed the triage vital signs and the nursing notes. ? ?Pertinent labs & imaging results that were available during my care of the patient were reviewed by me and considered in my medical decision making (see chart for details). ? ?  ? ?Patient significantly hypotensive, hypoxic in triage.  Too weak to ambulate independently though her baseline is ambulatory.  Multiple syncopal episodes yesterday, unclear trigger.  Point-of-care glucose was 151, EKG showing a junctional rhythm at 63 bpm with nonspecific ST changes throughout.  Given her unstable vital signs, complicated medical history with numerous risk factors for life-threatening illness and multiple syncopal episodes EMS was called for emergent transport to the emergency department.  Peripheral IV was started prior to EMS arrival. ? ?Final Clinical Impressions(s) / UC Diagnoses  ? ?Final diagnoses:  ?Syncope, unspecified syncope type  ?Hypotension, unspecified hypotension type  ?SOB (shortness of breath)  ?Hypoxia  ? ?Discharge Instructions   ?None ?  ? ?ED Prescriptions   ?None ?  ? ?PDMP not reviewed this encounter. ?  ?Volney American, PA-C ?05/06/2022 5009 ? ?

## 2022-05-11 NOTE — ED Notes (Signed)
Date and time results received: 05/20/2022 1145 ? ? ?Test: 8.7 ?Critical Value: Lactic acid ? ?Name of Provider Notified: Dr. Tomi Bamberger ? ?Orders Received? Or Actions Taken?: Notified ?

## 2022-05-12 ENCOUNTER — Inpatient Hospital Stay (HOSPITAL_COMMUNITY): Payer: Medicare HMO

## 2022-05-12 DIAGNOSIS — R570 Cardiogenic shock: Secondary | ICD-10-CM

## 2022-05-12 DIAGNOSIS — I50810 Right heart failure, unspecified: Secondary | ICD-10-CM | POA: Diagnosis not present

## 2022-05-12 DIAGNOSIS — I959 Hypotension, unspecified: Secondary | ICD-10-CM | POA: Diagnosis not present

## 2022-05-12 DIAGNOSIS — K7200 Acute and subacute hepatic failure without coma: Secondary | ICD-10-CM | POA: Diagnosis not present

## 2022-05-12 DIAGNOSIS — R579 Shock, unspecified: Secondary | ICD-10-CM

## 2022-05-12 LAB — COMPREHENSIVE METABOLIC PANEL
ALT: 760 U/L — ABNORMAL HIGH (ref 0–44)
AST: 899 U/L — ABNORMAL HIGH (ref 15–41)
Albumin: 3.2 g/dL — ABNORMAL LOW (ref 3.5–5.0)
Alkaline Phosphatase: 77 U/L (ref 38–126)
Anion gap: 12 (ref 5–15)
BUN: 30 mg/dL — ABNORMAL HIGH (ref 8–23)
CO2: 17 mmol/L — ABNORMAL LOW (ref 22–32)
Calcium: 7.9 mg/dL — ABNORMAL LOW (ref 8.9–10.3)
Chloride: 99 mmol/L (ref 98–111)
Creatinine, Ser: 2.93 mg/dL — ABNORMAL HIGH (ref 0.44–1.00)
GFR, Estimated: 16 mL/min — ABNORMAL LOW (ref >60)
Glucose, Bld: 150 mg/dL — ABNORMAL HIGH (ref 70–99)
Potassium: 4.5 mmol/L (ref 3.5–5.1)
Sodium: 128 mmol/L — ABNORMAL LOW (ref 135–145)
Total Bilirubin: 1.1 mg/dL (ref 0.3–1.2)
Total Protein: 5.2 g/dL — ABNORMAL LOW (ref 6.5–8.1)

## 2022-05-12 LAB — POCT I-STAT 7, (LYTES, BLD GAS, ICA,H+H)
Acid-base deficit: 11 mmol/L — ABNORMAL HIGH (ref 0.0–2.0)
Acid-base deficit: 6 mmol/L — ABNORMAL HIGH (ref 0.0–2.0)
Acid-base deficit: 6 mmol/L — ABNORMAL HIGH (ref 0.0–2.0)
Acid-base deficit: 9 mmol/L — ABNORMAL HIGH (ref 0.0–2.0)
Bicarbonate: 13.9 mmol/L — ABNORMAL LOW (ref 20.0–28.0)
Bicarbonate: 19.6 mmol/L — ABNORMAL LOW (ref 20.0–28.0)
Bicarbonate: 19.7 mmol/L — ABNORMAL LOW (ref 20.0–28.0)
Bicarbonate: 16.4 mmol/L — ABNORMAL LOW (ref 20.0–28.0)
Calcium, Ion: 0.97 mmol/L — ABNORMAL LOW (ref 1.15–1.40)
Calcium, Ion: 1.17 mmol/L (ref 1.15–1.40)
Calcium, Ion: 1.15 mmol/L (ref 1.15–1.40)
Calcium, Ion: 1.12 mmol/L — ABNORMAL LOW (ref 1.15–1.40)
HCT: 28 % — ABNORMAL LOW (ref 36.0–46.0)
HCT: 29 % — ABNORMAL LOW (ref 36.0–46.0)
HCT: 28 % — ABNORMAL LOW (ref 36.0–46.0)
HCT: 29 % — ABNORMAL LOW (ref 36.0–46.0)
Hemoglobin: 9.5 g/dL — ABNORMAL LOW (ref 12.0–15.0)
Hemoglobin: 9.9 g/dL — ABNORMAL LOW (ref 12.0–15.0)
Hemoglobin: 9.5 g/dL — ABNORMAL LOW (ref 12.0–15.0)
Hemoglobin: 9.9 g/dL — ABNORMAL LOW (ref 12.0–15.0)
O2 Saturation: 99 %
O2 Saturation: 99 %
O2 Saturation: 98 %
O2 Saturation: 100 %
Patient temperature: 97.9
Patient temperature: 97.9
Potassium: 4.1 mmol/L (ref 3.5–5.1)
Potassium: 4.5 mmol/L (ref 3.5–5.1)
Potassium: 4.6 mmol/L (ref 3.5–5.1)
Potassium: 4.9 mmol/L (ref 3.5–5.1)
Sodium: 128 mmol/L — ABNORMAL LOW (ref 135–145)
Sodium: 130 mmol/L — ABNORMAL LOW (ref 135–145)
Sodium: 128 mmol/L — ABNORMAL LOW (ref 135–145)
Sodium: 128 mmol/L — ABNORMAL LOW (ref 135–145)
TCO2: 15 mmol/L — ABNORMAL LOW (ref 22–32)
TCO2: 21 mmol/L — ABNORMAL LOW (ref 22–32)
TCO2: 21 mmol/L — ABNORMAL LOW (ref 22–32)
TCO2: 17 mmol/L — ABNORMAL LOW (ref 22–32)
pCO2 arterial: 26.4 mmHg — ABNORMAL LOW (ref 32–48)
pCO2 arterial: 39.9 mmHg (ref 32–48)
pCO2 arterial: 37.8 mmHg (ref 32–48)
pCO2 arterial: 32.9 mmHg (ref 32–48)
pH, Arterial: 7.3 — ABNORMAL LOW (ref 7.35–7.45)
pH, Arterial: 7.331 — ABNORMAL LOW (ref 7.35–7.45)
pH, Arterial: 7.324 — ABNORMAL LOW (ref 7.35–7.45)
pH, Arterial: 7.303 — ABNORMAL LOW (ref 7.35–7.45)
pO2, Arterial: 123 mmHg — ABNORMAL HIGH (ref 83–108)
pO2, Arterial: 165 mmHg — ABNORMAL HIGH (ref 83–108)
pO2, Arterial: 118 mmHg — ABNORMAL HIGH (ref 83–108)
pO2, Arterial: 188 mmHg — ABNORMAL HIGH (ref 83–108)

## 2022-05-12 LAB — CBC
HCT: 34.2 % — ABNORMAL LOW (ref 36.0–46.0)
Hemoglobin: 11.3 g/dL — ABNORMAL LOW (ref 12.0–15.0)
MCH: 33 pg (ref 26.0–34.0)
MCHC: 33 g/dL (ref 30.0–36.0)
MCV: 100 fL (ref 80.0–100.0)
Platelets: 118 10*3/uL — ABNORMAL LOW (ref 150–400)
RBC: 3.42 MIL/uL — ABNORMAL LOW (ref 3.87–5.11)
RDW: 13.9 % (ref 11.5–15.5)
WBC: 9 10*3/uL (ref 4.0–10.5)
nRBC: 0 % (ref 0.0–0.2)

## 2022-05-12 LAB — COOXEMETRY PANEL
Carboxyhemoglobin: 0.8 % (ref 0.5–1.5)
Carboxyhemoglobin: 0.6 % (ref 0.5–1.5)
Methemoglobin: <0.7 % (ref 0.0–1.5)
Methemoglobin: <0.7 % (ref 0.0–1.5)
O2 Saturation: 41.3 %
O2 Saturation: 51.5 %
Total hemoglobin: 11.5 g/dL — ABNORMAL LOW (ref 12.0–16.0)
Total hemoglobin: 10.8 g/dL — ABNORMAL LOW (ref 12.0–16.0)

## 2022-05-12 LAB — GLUCOSE, CAPILLARY: Glucose-Capillary: 150 mg/dL — ABNORMAL HIGH (ref 70–99)

## 2022-05-12 LAB — BASIC METABOLIC PANEL
Anion gap: 18 — ABNORMAL HIGH (ref 5–15)
BUN: 36 mg/dL — ABNORMAL HIGH (ref 8–23)
CO2: 13 mmol/L — ABNORMAL LOW (ref 22–32)
Calcium: 7.3 mg/dL — ABNORMAL LOW (ref 8.9–10.3)
Chloride: 99 mmol/L (ref 98–111)
Creatinine, Ser: 3.71 mg/dL — ABNORMAL HIGH (ref 0.44–1.00)
GFR, Estimated: 12 mL/min — ABNORMAL LOW (ref >60)
Glucose, Bld: 130 mg/dL — ABNORMAL HIGH (ref 70–99)
Potassium: 4.7 mmol/L (ref 3.5–5.1)
Sodium: 130 mmol/L — ABNORMAL LOW (ref 135–145)

## 2022-05-12 LAB — CBC WITH DIFFERENTIAL/PLATELET
Abs Immature Granulocytes: 0.09 10*3/uL — ABNORMAL HIGH (ref 0.00–0.07)
Basophils Absolute: 0 10*3/uL (ref 0.0–0.1)
Basophils Relative: 0 %
Eosinophils Absolute: 0 10*3/uL (ref 0.0–0.5)
Eosinophils Relative: 0 %
HCT: 27.7 % — ABNORMAL LOW (ref 36.0–46.0)
Hemoglobin: 9.3 g/dL — ABNORMAL LOW (ref 12.0–15.0)
Immature Granulocytes: 1 %
Lymphocytes Relative: 21 %
Lymphs Abs: 2 10*3/uL (ref 0.7–4.0)
MCH: 33.1 pg (ref 26.0–34.0)
MCHC: 33.6 g/dL (ref 30.0–36.0)
MCV: 98.6 fL (ref 80.0–100.0)
Monocytes Absolute: 0.5 10*3/uL (ref 0.1–1.0)
Monocytes Relative: 6 %
Neutro Abs: 6.9 10*3/uL (ref 1.7–7.7)
Neutrophils Relative %: 72 %
Platelets: 88 10*3/uL — ABNORMAL LOW (ref 150–400)
RBC: 2.81 MIL/uL — ABNORMAL LOW (ref 3.87–5.11)
RDW: 13.8 % (ref 11.5–15.5)
WBC: 9.5 10*3/uL (ref 4.0–10.5)
nRBC: 0.5 % — ABNORMAL HIGH (ref 0.0–0.2)

## 2022-05-12 LAB — MAGNESIUM
Magnesium: 2 mg/dL (ref 1.7–2.4)
Magnesium: 2 mg/dL (ref 1.7–2.4)

## 2022-05-12 LAB — PROTIME-INR
INR: 2.5 — ABNORMAL HIGH (ref 0.8–1.2)
Prothrombin Time: 26.6 seconds — ABNORMAL HIGH (ref 11.4–15.2)

## 2022-05-12 LAB — RENAL FUNCTION PANEL
Albumin: 3.3 g/dL — ABNORMAL LOW (ref 3.5–5.0)
Anion gap: 16 — ABNORMAL HIGH (ref 5–15)
BUN: 34 mg/dL — ABNORMAL HIGH (ref 8–23)
CO2: 12 mmol/L — ABNORMAL LOW (ref 22–32)
Calcium: 8.2 mg/dL — ABNORMAL LOW (ref 8.9–10.3)
Chloride: 98 mmol/L (ref 98–111)
Creatinine, Ser: 3.7 mg/dL — ABNORMAL HIGH (ref 0.44–1.00)
GFR, Estimated: 12 mL/min — ABNORMAL LOW (ref >60)
Glucose, Bld: 134 mg/dL — ABNORMAL HIGH (ref 70–99)
Phosphorus: 7 mg/dL — ABNORMAL HIGH (ref 2.5–4.6)
Potassium: 5.1 mmol/L (ref 3.5–5.1)
Sodium: 126 mmol/L — ABNORMAL LOW (ref 135–145)

## 2022-05-12 LAB — LACTIC ACID, PLASMA
Lactic Acid, Venous: 4.2 mmol/L (ref 0.5–1.9)
Lactic Acid, Venous: 7.7 mmol/L (ref 0.5–1.9)
Lactic Acid, Venous: 4.4 mmol/L (ref 0.5–1.9)

## 2022-05-12 LAB — APTT: aPTT: 92 seconds — ABNORMAL HIGH (ref 24–36)

## 2022-05-12 LAB — HEPARIN LEVEL (UNFRACTIONATED)
Heparin Unfractionated: 0.21 IU/mL — ABNORMAL LOW (ref 0.30–0.70)
Heparin Unfractionated: 0.12 IU/mL — ABNORMAL LOW (ref 0.30–0.70)

## 2022-05-12 LAB — CK: Total CK: 1346 U/L — ABNORMAL HIGH (ref 38–234)

## 2022-05-12 LAB — ACETAMINOPHEN LEVEL: Acetaminophen (Tylenol), Serum: <10 ug/mL — ABNORMAL LOW (ref 10–30)

## 2022-05-12 LAB — AMMONIA: Ammonia: 39 umol/L — ABNORMAL HIGH (ref 9–35)

## 2022-05-12 MED ORDER — ATORVASTATIN CALCIUM 80 MG PO TABS
80.0000 mg | ORAL_TABLET | Freq: Every day | ORAL | Status: DC
  Filled 2022-05-12: qty 1

## 2022-05-12 MED ORDER — DOBUTAMINE IN D5W 4-5 MG/ML-% IV SOLN
3.0000 ug/kg/min | INTRAVENOUS | Status: DC
  Administered 2022-05-12: 7.5 ug/kg/min via INTRAVENOUS
  Filled 2022-05-12 (×2): qty 250

## 2022-05-12 MED ORDER — SODIUM CHLORIDE 0.9 % IV BOLUS
500.0000 mL | Freq: Once | INTRAVENOUS | Status: AC
  Administered 2022-05-12: 500 mL via INTRAVENOUS

## 2022-05-12 MED ORDER — NOREPINEPHRINE 16 MG/250ML-% IV SOLN
0.0000 ug/min | INTRAVENOUS | Status: DC
  Administered 2022-05-13: 40 ug/min via INTRAVENOUS
  Administered 2022-05-13: 55 ug/min via INTRAVENOUS
  Filled 2022-05-12 (×2): qty 250

## 2022-05-12 MED ORDER — NOREPINEPHRINE BITARTRATE 1 MG/ML IV SOLN
0.0000 ug/min | INTRAVENOUS | Status: DC
  Administered 2022-05-12: 25 ug/min via INTRAVENOUS
  Filled 2022-05-12: qty 16

## 2022-05-12 MED ORDER — EPINEPHRINE HCL 5 MG/250ML IV SOLN IN NS
0.5000 ug/min | INTRAVENOUS | Status: DC
  Administered 2022-05-12: 1 ug/min via INTRAVENOUS
  Administered 2022-05-13: 20 ug/min via INTRAVENOUS
  Filled 2022-05-12 (×2): qty 250

## 2022-05-12 MED ORDER — OXCARBAZEPINE 150 MG PO TABS
150.0000 mg | ORAL_TABLET | Freq: Two times a day (BID) | ORAL | Status: DC
  Administered 2022-05-12: 150 mg via ORAL
  Filled 2022-05-12 (×3): qty 1

## 2022-05-12 MED ORDER — LEVOTHYROXINE SODIUM 88 MCG PO TABS
88.0000 ug | ORAL_TABLET | Freq: Every day | ORAL | Status: DC
  Filled 2022-05-12: qty 1

## 2022-05-12 MED ORDER — PRISMASOL BGK 4/2.5 32-4-2.5 MEQ/L EC SOLN: Status: DC

## 2022-05-12 MED ORDER — SODIUM CHLORIDE 0.9 % IV SOLN: 2.0000 g | Freq: Two times a day (BID) | INTRAVENOUS | Status: DC

## 2022-05-12 MED ORDER — PRISMASOL BGK 4/2.5 32-4-2.5 MEQ/L REPLACEMENT SOLN: Status: DC

## 2022-05-12 MED ORDER — ATORVASTATIN CALCIUM 40 MG PO TABS
40.0000 mg | ORAL_TABLET | Freq: Every day | ORAL | Status: DC
  Administered 2022-05-12: 40 mg via ORAL
  Filled 2022-05-12: qty 1

## 2022-05-12 MED ORDER — VASOPRESSIN 20 UNITS/100 ML INFUSION FOR SHOCK
0.0400 [IU]/min | INTRAVENOUS | Status: DC
  Administered 2022-05-13: 0.03 [IU]/min via INTRAVENOUS
  Filled 2022-05-12: qty 100

## 2022-05-12 MED ORDER — METOCLOPRAMIDE HCL 5 MG/ML IJ SOLN
10.0000 mg | Freq: Four times a day (QID) | INTRAMUSCULAR | Status: DC | PRN
  Administered 2022-05-12: 10 mg via INTRAVENOUS
  Filled 2022-05-12: qty 2

## 2022-05-12 MED ORDER — FENTANYL CITRATE PF 50 MCG/ML IJ SOSY
PREFILLED_SYRINGE | INTRAMUSCULAR | Status: AC
  Filled 2022-05-12: qty 2

## 2022-05-12 MED ORDER — EPINEPHRINE HCL 5 MG/250ML IV SOLN IN NS: 5.0000 ug/min | INTRAVENOUS | Status: DC

## 2022-05-12 MED ORDER — FUROSEMIDE 10 MG/ML IJ SOLN
120.0000 mg | Freq: Two times a day (BID) | INTRAVENOUS | Status: DC
  Administered 2022-05-12: 120 mg via INTRAVENOUS
  Filled 2022-05-12: qty 12
  Filled 2022-05-12: qty 10

## 2022-05-12 MED ORDER — FENTANYL CITRATE PF 50 MCG/ML IJ SOSY: 25.0000 ug | PREFILLED_SYRINGE | Freq: Once | INTRAMUSCULAR | Status: DC

## 2022-05-12 MED ORDER — SUCCINYLCHOLINE CHLORIDE 200 MG/10ML IV SOSY
PREFILLED_SYRINGE | INTRAVENOUS | Status: DC
Start: 2022-05-12 — End: 2022-05-12
  Filled 2022-05-12: qty 10

## 2022-05-12 MED ORDER — SODIUM CHLORIDE 0.9 % IV SOLN: INTRAVENOUS | Status: DC | PRN

## 2022-05-12 MED ORDER — ORAL CARE MOUTH RINSE
15.0000 mL | OROMUCOSAL | Status: DC
  Administered 2022-05-12 – 2022-05-13 (×5): 15 mL via OROMUCOSAL

## 2022-05-12 MED ORDER — DOBUTAMINE INFUSION FOR EP/ECHO/NUC (1000 MCG/ML)
0.0000 ug/kg/min | INTRAVENOUS | Status: DC
  Filled 2022-05-12: qty 250

## 2022-05-12 MED ORDER — FENTANYL BOLUS VIA INFUSION
25.0000 ug | INTRAVENOUS | Status: DC | PRN
  Administered 2022-05-12: 50 ug via INTRAVENOUS
  Administered 2022-05-13 (×2): 100 ug via INTRAVENOUS
  Filled 2022-05-12: qty 100

## 2022-05-12 MED ORDER — SODIUM BICARBONATE 8.4 % IV SOLN
INTRAVENOUS | Status: AC
  Filled 2022-05-12: qty 50

## 2022-05-12 MED ORDER — HEPARIN SODIUM (PORCINE) 1000 UNIT/ML DIALYSIS
1000.0000 [IU] | INTRAMUSCULAR | Status: DC | PRN
  Filled 2022-05-12 (×2): qty 6
  Filled 2022-05-12: qty 3

## 2022-05-12 MED ORDER — SODIUM CHLORIDE 0.9 % IV SOLN: 2.0000 g | INTRAVENOUS | Status: DC

## 2022-05-12 MED ORDER — ROSUVASTATIN CALCIUM 20 MG PO TABS
20.0000 mg | ORAL_TABLET | Freq: Every day | ORAL | Status: DC
Start: 2022-05-12 — End: 2022-05-12

## 2022-05-12 MED ORDER — MIDAZOLAM HCL 2 MG/2ML IJ SOLN
INTRAMUSCULAR | Status: AC
  Filled 2022-05-12: qty 2

## 2022-05-12 MED ORDER — ETOMIDATE 2 MG/ML IV SOLN
INTRAVENOUS | Status: AC
  Filled 2022-05-12: qty 20

## 2022-05-12 MED ORDER — POLYETHYLENE GLYCOL 3350 17 G PO PACK: 17.0000 g | PACK | Freq: Every day | ORAL | Status: DC

## 2022-05-12 MED ORDER — ROCURONIUM BROMIDE 10 MG/ML (PF) SYRINGE
PREFILLED_SYRINGE | INTRAVENOUS | Status: AC
  Filled 2022-05-12: qty 10

## 2022-05-12 MED ORDER — MIDAZOLAM HCL 2 MG/2ML IJ SOLN: 2.0000 mg | Freq: Once | INTRAMUSCULAR | Status: AC

## 2022-05-12 MED ORDER — VASOPRESSIN 20 UNITS/100 ML INFUSION FOR SHOCK
INTRAVENOUS | Status: AC
  Administered 2022-05-12: 0.04 [IU]/min via INTRAVENOUS
  Filled 2022-05-12: qty 100

## 2022-05-12 MED ORDER — REVEFENACIN 175 MCG/3ML IN SOLN
175.0000 ug | Freq: Every day | RESPIRATORY_TRACT | Status: DC
  Administered 2022-05-12: 175 ug via RESPIRATORY_TRACT
  Filled 2022-05-12: qty 3

## 2022-05-12 MED ORDER — MIDAZOLAM HCL 2 MG/2ML IJ SOLN
INTRAMUSCULAR | Status: AC
Start: 2022-05-12 — End: 2022-05-12
  Administered 2022-05-12: 2 mg via INTRAVENOUS
  Filled 2022-05-12: qty 2

## 2022-05-12 MED ORDER — "THROMBI-PAD 3""X3"" EX PADS"
1.0000 | MEDICATED_PAD | Freq: Once | CUTANEOUS | Status: AC
  Administered 2022-05-12: 1 via TOPICAL
  Filled 2022-05-12: qty 1

## 2022-05-12 MED ORDER — FENTANYL 2500MCG IN NS 250ML (10MCG/ML) PREMIX INFUSION
25.0000 ug/h | INTRAVENOUS | Status: DC
  Administered 2022-05-12: 25 ug/h via INTRAVENOUS
  Filled 2022-05-12: qty 250

## 2022-05-12 MED ORDER — DOCUSATE SODIUM 50 MG/5ML PO LIQD: 100.0000 mg | Freq: Two times a day (BID) | ORAL | Status: DC

## 2022-05-12 MED ORDER — CHLORHEXIDINE GLUCONATE 0.12% ORAL RINSE (MEDLINE KIT)
15.0000 mL | Freq: Two times a day (BID) | OROMUCOSAL | Status: DC
  Administered 2022-05-12: 15 mL via OROMUCOSAL

## 2022-05-12 NOTE — Progress Notes (Signed)
?   05/12/22 1625  ?Clinical Encounter Type  ?Visited With Patient not available  ?Visit Type Initial;Code  ?Referral From Nurse  ?Consult/Referral To Chaplain  ? ?Chaplain responded to a code blue. Patient was receiving medical care.  ?No family was present.  ? ?Danice Goltz  ?Chaplain  ?[facility]  ?[phone] ?

## 2022-05-12 NOTE — Progress Notes (Signed)
Continue to struggle with BP, levo @ 40, Epi @ 5. Frequent communication with eLink during shift. Will talk to cards for input.  ? ?Spoke to Dr Toney Rakes, cards fellow, order to restart dobutamine at 3.  ?

## 2022-05-12 NOTE — Procedures (Signed)
Intubation Procedure Note ? ?Erin Cooper  ?[id number]  ?Sep 10, 1951 ? ?Date:05/12/22  ?Time:8:51 PM  ? ?Provider Performing:Franklin Baumbach Merlene Pulling  ? ? ?Procedure: Intubation (26712) ? ?Indication(s) ?Respiratory Failure ? ?Consent ?Risks of the procedure as well as the alternatives and risks of each were explained to the patient and/or caregiver.  Consent for the procedure was obtained and is signed in the bedside chart ? ? ?Anesthesia ?Etomidate, Versed, and Rocuronium ? ? ?Time Out ?Verified patient identification, verified procedure, site/side was marked, verified correct patient position, special equipment/implants available, medications/allergies/relevant history reviewed, required imaging and test results available. ? ? ?Sterile Technique ?Usual hand hygeine, masks, and gloves were used ? ? ?Procedure Description ?Patient positioned in bed supine.  Sedation given as noted above.  Patient was intubated with endotracheal tube using Glidescope.  View was Grade 2 only posterior commissure .  Number of attempts was 1.  Colorimetric CO2 detector was consistent with tracheal placement. ? ? ?Complications/Tolerance ?None; patient tolerated the procedure well. ?Chest X-ray is ordered to verify placement. ? ? ?EBL ?Minimal ? ? ?Specimen(s) ?None  ?

## 2022-05-12 NOTE — Progress Notes (Signed)
Brief PCCM Progress Note ? ?Brief arrest - 1 round of ACLS including epi with ROSC right after starting CRRT. Intermittently following commands afterward but agonal respirations. Intubated. Stopped dobutamine and started epi at 4 mcg/min, added vaso at 0.04, and leaving titratable levo. We will consider restarting CRRT if stabilizes. Updated family. See separate IPAL note. ? ?Walker Shadow  ?[place] ? ?

## 2022-05-12 NOTE — Progress Notes (Signed)
?Cardiology Progress Note  ?Patient ID: Erin Cooper ?MRN: [id number] ?DOB: Dec 02, 1951 ?Date of Encounter: 05/12/2022 ? ?Primary Cardiologist: Carlyle Dolly, MD ? ?Subjective  ? ?Chief Complaint: Weakness/Fatigue ? ?HPI: Mixed venous suggests cardiogenic shock.  Lactic acid improving.  Feels weak and fatigued.  Urine output is reduced.  CVP 16 to 18 mmHg.  On dobutamine and Levophed. ? ?ROS:  ?All other ROS reviewed and negative. Pertinent positives noted in the HPI.    ? ?Inpatient Medications  ?Scheduled Meds: ? Chlorhexidine Gluconate Cloth  6 each Topical Q0600  ? melatonin  3 mg Oral QHS  ? ?Continuous Infusions: ? sodium chloride 10 mL/hr at 05/12/22 0600  ? ceFEPime (MAXIPIME) IV    ? DOBUTamine 2.5 mcg/kg/min (05/12/22 0600)  ? heparin 850 Units/hr (05/12/22 4235)  ? norepinephrine (LEVOPHED) Adult infusion 15 mcg/min (05/12/22 0515)  ? ?PRN Meds: ?docusate sodium, metoCLOPramide (REGLAN) injection, ondansetron (ZOFRAN) IV, polyethylene glycol  ? ?Vital Signs  ? ?Vitals:  ? 05/12/22 0300 05/12/22 0400 05/12/22 0500 05/12/22 0600  ?BP: 101/63 (!) 124/113 (!) 84/64 (!) 94/43  ?Pulse: 64 75 60 63  ?Resp: (!) 21 (!) 22 (!) 24 (!) 23  ?Temp:      ?TempSrc:  Oral    ?SpO2: 95% 96% 98% 96%  ?Weight:   70 kg   ?Height:      ? ? ?Intake/Output Summary (Last 24 hours) at 05/12/2022 0659 ?Last data filed at 05/12/2022 0600 ?Gross per 24 hour  ?Intake 1149.41 ml  ?Output 2 ml  ?Net 1147.41 ml  ? ? ?  05/12/2022  ?  5:00 AM 04/30/2022  ?  9:05 AM 06/05/2021  ?  8:36 AM  ?Last 3 Weights  ?Weight (lbs) 154 lb 5.2 oz 140 lb 140 lb  ?Weight (kg) 70 kg 63.504 kg 63.504 kg  ?   ? ?Telemetry  ?Overnight telemetry shows junctional rhythm in the 60s, brief SVT, which I personally reviewed.  ? ?ECG  ?The most recent ECG shows accelerated junctional rhythm heart rate 67, no acute ischemic changes, which I personally reviewed.  ? ?Physical Exam  ? ?Vitals:  ? 05/12/22 0300 05/12/22 0400 05/12/22 0500 05/12/22 0600  ?BP: 101/63 (!)  124/113 (!) 84/64 (!) 94/43  ?Pulse: 64 75 60 63  ?Resp: (!) 21 (!) 22 (!) 24 (!) 23  ?Temp:      ?TempSrc:  Oral    ?SpO2: 95% 96% 98% 96%  ?Weight:   70 kg   ?Height:      ?  ?Intake/Output Summary (Last 24 hours) at 05/12/2022 0659 ?Last data filed at 05/12/2022 0600 ?Gross per 24 hour  ?Intake 1149.41 ml  ?Output 2 ml  ?Net 1147.41 ml  ?  ? ?  05/12/2022  ?  5:00 AM 05/01/2022  ?  9:05 AM 06/05/2021  ?  8:36 AM  ?Last 3 Weights  ?Weight (lbs) 154 lb 5.2 oz 140 lb 140 lb  ?Weight (kg) 70 kg 63.504 kg 63.504 kg  ?  Body mass index is 26.49 kg/m?.  ?General: Well nourished, well developed, in no acute distress ?Head: Atraumatic, normal size  ?Eyes: PEERLA, EOMI  ?Neck: Supple, JVD 12 to 15 cm of water ?Endocrine: No thryomegaly ?Cardiac: Normal S1, S2; RRR; no murmurs, rubs, or gallops ?Lungs: Diminished breath sounds bilaterally ?Abd: Soft, nontender, no hepatomegaly  ?Ext: No edema, pulses 2+ ?Musculoskeletal: No deformities, BUE and BLE strength normal and equal ?Skin: Warm and dry, no rashes   ?Neuro: Alert and oriented  to person, place, time, and situation, CNII-XII grossly intact, no focal deficits  ?Psych: Normal mood and affect  ? ?Labs  ?High Sensitivity Troponin:   ?Recent Labs  ?Lab 05/28/2022 ?0912  ?TROPONINIHS >24,000*  ?   ?Cardiac EnzymesNo results for input(s): TROPONINI in the last 168 hours. No results for input(s): TROPIPOC in the last 168 hours.  ?Chemistry ?Recent Labs  ?Lab 05/10/2022 ?0912 05/29/2022 ?0932 05/12/22 ?0429  ?NA 132* 131* 128*  ?K 3.7 3.8 4.5  ?CL 101 100 99  ?CO2 18*  --  17*  ?GLUCOSE 152* 139* 150*  ?BUN 22 22 30*  ?CREATININE 1.87* 1.80* 2.93*  ?CALCIUM 8.3*  --  7.9*  ?PROT 6.0*  --  5.2*  ?ALBUMIN 3.7  --  3.2*  ?AST 111*  --  899*  ?ALT 65*  --  760*  ?ALKPHOS 64  --  77  ?BILITOT 0.6  --  1.1  ?GFRNONAA 28*  --  16*  ?ANIONGAP 13  --  12  ?  ?Hematology ?Recent Labs  ?Lab 05/10/2022 ?0912 05/09/2022 ?0932 05/12/22 ?0429  ?WBC 10.5  --  9.0  ?RBC 3.68*  --  3.42*  ?HGB 12.4 12.9 11.3*   ?HCT 36.7 38.0 34.2*  ?MCV 99.7  --  100.0  ?MCH 33.7  --  33.0  ?MCHC 33.8  --  33.0  ?RDW 13.9  --  13.9  ?PLT 199  --  118*  ? ?BNP ?Recent Labs  ?Lab 05/03/2022 ?0912  ?BNP 237.0*  ?  ?DDimer No results for input(s): DDIMER in the last 168 hours.  ? ?Radiology  ?DG Chest 2 View ? ?Result Date: 05/06/2022 ?CLINICAL DATA:  States she was working outside yesterday and went inside and states she passed out. States legs were weak. States she has no strength and is nauseated. States she passed out twice, denies CP, hx of COPD. EXAM: CHEST - 2 VIEW COMPARISON:  03/16/2021 FINDINGS: Three sternal wires project over the upper sternum, stable. Cardiac silhouette is mildly enlarged. No mediastinal or hilar masses. Bilateral prominent interstitial markings and lung hyperexpansion, stable. No evidence of pneumonia or pulmonary edema. No convincing pleural effusion and no pneumothorax. Skeletal structures are grossly intact. IMPRESSION: No acute cardiopulmonary disease. Electronically Signed   By: Lajean Manes M.D.   On: 05/18/2022 10:48  ? ?CT HEAD WO CONTRAST (5MM) ? ?Result Date: 05/06/2022 ?CLINICAL DATA:  Head trauma EXAM: CT HEAD WITHOUT CONTRAST TECHNIQUE: Contiguous axial images were obtained from the base of the skull through the vertex without intravenous contrast. RADIATION DOSE REDUCTION: This exam was performed according to the departmental dose-optimization program which includes automated exposure control, adjustment of the mA and/or kV according to patient size and/or use of iterative reconstruction technique. COMPARISON:  None Available. FINDINGS: Brain: No acute intracranial hemorrhage, mass effect, or herniation. No extra-axial fluid collections. No evidence of acute territorial infarct. No hydrocephalus. Mild cortical volume loss. Mild patchy hypodensities in the periventricular and subcortical white matter, likely secondary to chronic microvascular ischemic changes. Vascular: Calcified plaques in the  carotid siphons. Skull: Normal. Negative for fracture or focal lesion. Sinuses/Orbits: No acute finding. Other: None. IMPRESSION: Chronic changes with no acute intracranial process identified. Electronically Signed   By: Ofilia Neas M.D.   On: 05/07/2022 16:33  ? ?CT Angio Chest Pulmonary Embolism (PE) W or WO Contrast ? ?Result Date: 05/21/2022 ?CLINICAL DATA:  Suspected pulmonary embolism.  Syncope EXAM: CT ANGIOGRAPHY CHEST WITH CONTRAST TECHNIQUE: Multidetector CT imaging of the chest  was performed using the standard protocol during bolus administration of intravenous contrast. Multiplanar CT image reconstructions and MIPs were obtained to evaluate the vascular anatomy. RADIATION DOSE REDUCTION: This exam was performed according to the departmental dose-optimization program which includes automated exposure control, adjustment of the mA and/or kV according to patient size and/or use of iterative reconstruction technique. CONTRAST:  4m OMNIPAQUE IOHEXOL 350 MG/ML SOLN COMPARISON:  Chest x-ray earlier the same day, CT chest earlier the same day FINDINGS: Cardiovascular: No pulmonary embolism identified. Main pulmonary artery is normal caliber. Heart is enlarged. No pericardial effusion identified. Coronary artery calcifications. Reflux of contrast into the hepatic veins. Thoracic aorta is normal caliber. Severe atherosclerotic disease. Mediastinum/Nodes: No bulky axillary, hilar or mediastinal lymphadenopathy identified. Calcified subcarinal lymph nodes. Lungs/Pleura: Mild emphysematous changes of the lungs, upper lobe predominant. Biapical pleural thickening. Mild breathing motion and subsegmental atelectatic changes bilaterally. Trace right pleural effusion. No focal consolidation identified. No pneumothorax. Upper Abdomen: No acute process identified. Calcified granulomas in the spleen. Musculoskeletal: Degenerative changes in the spine. No suspicious bony lesions identified. Review of the MIP images  confirms the above findings. IMPRESSION: 1. No pulmonary embolism identified. 2. Cardiomegaly. Reflux of contrast into the hepatic veins which can be seen with right heart failure. 3. Mild emphysematous changes.  Tra

## 2022-05-12 NOTE — Progress Notes (Signed)
0300 Communication to [place] provider Fenton Malling DC) regarding pt.urine output. Pt. has not voided for night shift or day shift. Bladder scan showed ~35cc in bladder. 500cc .9% NS bolus ordered. ? ?0600 Communication to [place] provider Fenton Malling DC) regarding pt. am labs and lack of urine output. Provider aware and no changes made to current regimen at this time. ? ?This nurse will continue to monitor  ? ? ?

## 2022-05-12 NOTE — Progress Notes (Signed)
ANTICOAGULATION CONSULT NOTE - Follow Up Consult ? ?Pharmacy Consult for heparin ?Indication:  NSTEMI ? ?Labs: ?Recent Labs  ?  05/04/2022 ?0912 05/03/2022 ?0932 04/29/2022 ?1856 05/12/22 ?0429  ?HGB 12.4 12.9  --  11.3*  ?HCT 36.7 38.0  --  34.2*  ?PLT 199  --   --  PENDING  ?HEPARINUNFRC  --   --  0.35 0.21*  ?CREATININE 1.87* 1.80*  --  2.93*  ?CKTOTAL  --   --   --  1,346*  ?TROPONINIHS >24,000*  --   --   --   ? ? ?Assessment: ?71yo female subtherapeutic on heparin after one level at low end of goal, likely d/t initial bolus; no infusion issues or signs of bleeding per RN. ? ?Goal of Therapy:  ?Heparin level 0.3-0.7 units/ml ?  ?Plan:  ?Will increase heparin infusion by 2 units/kg/hr to 850 units/hr and check level in 8 hours.   ? ?Wynona Neat, PharmD, BCPS  ?05/12/2022,5:55 AM ? ? ?

## 2022-05-12 NOTE — Procedures (Signed)
Arterial Catheter Insertion Procedure Note ? ?Fulton Mole  ?[id number]  ?10/04/1951 ? ?Date:05/12/22  ?Time:3:29 PM  ? ? ?Provider Performing: [name]  ? ? ?Procedure: Insertion of Arterial Line [id number]) with US guidance (91694)  ? ?Indication(s) ?Blood pressure monitoring and/or need for frequent ABGs ? ?Consent ?Risks of the procedure as well as the alternatives and risks of each were explained to the patient and/or caregiver.  Consent for the procedure was obtained and is signed in the bedside chart ? ?Anesthesia ?1% lidocaine ? ? ?Time Out ?Verified patient identification, verified procedure, site/side was marked, verified correct patient position, special equipment/implants available, medications/allergies/relevant history reviewed, required imaging and test results available. ? ? ?Sterile Technique ?Maximal sterile technique including full sterile barrier drape, hand hygiene, sterile gown, sterile gloves, mask, hair covering, sterile ultrasound probe cover (if used). ? ? ?Procedure Description ?Area of catheter insertion was cleaned with chlorhexidine and draped in sterile fashion. With real-time ultrasound guidance an arterial catheter was placed into the left femoral artery.  Appropriate arterial tracings confirmed on monitor.   ? ?Battery expired before [place] image could be saved ? ? ?Complications/Tolerance ?None; patient tolerated the procedure well. ? ? ?EBL ?Minimal ? ? ?Specimen(s) ?None ? ?

## 2022-05-12 NOTE — Progress Notes (Signed)
? ?NAME:  Erin Cooper, MRN:  [id number], DOB:  September 22, 1951, LOS: 1 ?ADMISSION DATE:  05/21/2022, CONSULTATION DATE:  04/30/2022 ?REFERRING MD:  Tomi Bamberger, CHIEF COMPLAINT:  syncope  ? ?History of Present Illness:  ?71yF with history of uterine cancer, COPD, CAD, HTN, seizures, hypothyroid who presented to [facility] ED for multiple syncope episodes, nausea, weakness, episodic dyspnea. Wonders if it was related to heat exposure yesterday.  ? ?Seen last week for R groin abscess and completed a course of doxy, thought it was improving ? ?At [facility] ED found to be in shock, suspected cardiogenic but concern for septic as well with SSTI vs unknown source, NSTEMI. Given 2L NS, ASA 325, started on vanc/cefepime/flagyl, levophed, heparin gtt, cardiology consulted. ? ?Pertinent  Medical History  ?Uterine cancer ?COPD ?CAD ?HTN ?Seizures ?Hypothyroid ? ?Significant Hospital Events: ?Including procedures, antibiotic start and stop dates in addition to other pertinent events   ?5/13 admitted ? ?Interim History / Subjective:  ? ?Still nauseated this morning, little to no UOP.  ? ?Objective   ?Blood pressure (!) 86/67, pulse 64, temperature 97.8 ?F (36.6 ?C), temperature source Oral, resp. rate (!) 22, height '5\' 4"'$  (1.626 m), weight 70 kg, SpO2 97 %. ?CVP:  [13 mmHg-29 mmHg] 29 mmHg  ?   ? ?Intake/Output Summary (Last 24 hours) at 05/12/2022 0805 ?Last data filed at 05/12/2022 0700 ?Gross per 24 hour  ?Intake 1169.39 ml  ?Output 2 ml  ?Net 1167.39 ml  ? ?Filed Weights  ? 05/21/2022 0905 05/12/22 0500  ?Weight: 63.5 kg 70 kg  ? ? ?Examination: ?General appearance: 71 y.o., female, NAD, conversant  ?Eyes: anicteric sclerae; PERRL, tracking appropriately ?HENT: NCAT; MMM ?Neck: Trachea midline; no lymphadenopathy, no JVD ?Lungs: CTAB, no crackles, no wheeze, with normal respiratory effort ?CV: RRR, no murmur  ?Abdomen: Soft, non-tender; non-distended, BS present  ?Extremities: No peripheral edema, warm ?Skin: Normal turgor and texture; no rash ?Psych:  Appropriate affect ?Neuro: Alert and oriented to person and place, no focal deficit  ? ?LFTs rising sharply ?S Cr rising ?Elevated CK ?Lactic down ? ?Coox 45% ? ? ? ?Resolved Hospital Problem list   ? ?Assessment & Plan:  ? ?# Shock due to RV failure ?Previous echo in distant past with RVSP of 51, mod TR. Trop of 24k seems out of proportion to EKG changes but would need [facility] evaluation depending on renal recovery, trajectory overall. ?- wean levo for MAP 65 ?- dobutamine 5 ?- diuretic challenge ?- cardiology, advanced heart failure following ?- continue heparin gtt ?- 48h rule out at most of cefepime ? ?# NSTEMI  ?As above alternatively PE.  ?- TTE ?- s/p ASA 325, continue heparin gtt ?- cardiology following ? ?# AKI ?- diuretic challenge ?- she is willing to undergo temporary RRT if needed ? ?# Shock liver ?Shock and congestive hepatopathy in setting decompensated RV failure ?- trend LFTs ? ?# Elevated CK ?- check again tomorrow morning ? ?# Lactic acidosis ?Due to decompensated RV failure, poor clearance in setting of liver injury ?- trend ?- CTM response to resuscitative measures above ? ?# Transaminitis ?- trend LFTs ? ?# Emphysema ?Not wheezy, normal WOB, not on bronchodilators at home ?- start yupelri while here  ? ?Best Practice (right click and "Reselect all SmartList Selections" daily)  ? ?Diet/type: NPO ?DVT prophylaxis: systemic heparin ?GI prophylaxis: N/A ?Lines: Central line ?Foley:  Yes, and it is still needed ?Code Status:  full code ?Last date of multidisciplinary goals of care discussion [Pt updated  at bedside.] ? ?Critical care time: 35 minutes ?  ? ? ? ?  ?

## 2022-05-12 NOTE — Procedures (Signed)
Central Venous Catheter Insertion Procedure Note ? ?Fulton Mole  ?[id number]  ?May 01, 1951 ? ?Date:05/12/22  ?Time:3:30 PM  ? ?Provider Performing:Veasna Santibanez Merlene Pulling  ? ?Procedure: Insertion of Non-tunneled Central Venous Catheter(36556)with US guidance ([id number])   ? ?Indication(s) ?Hemodialysis ? ?Consent ?Risks of the procedure as well as the alternatives and risks of each were explained to the patient and/or caregiver.  Consent for the procedure was obtained and is signed in the bedside chart ? ?Anesthesia ?Topical only with 1% lidocaine  ? ?Timeout ?Verified patient identification, verified procedure, site/side was marked, verified correct patient position, special equipment/implants available, medications/allergies/relevant history reviewed, required imaging and test results available. ? ?Sterile Technique ?Maximal sterile technique including full sterile barrier drape, hand hygiene, sterile gown, sterile gloves, mask, hair covering, sterile ultrasound probe cover (if used). ? ?Procedure Description ?Area of catheter insertion was cleaned with chlorhexidine and draped in sterile fashion.   With real-time ultrasound guidance a HD catheter was placed into the right internal jugular vein.  Nonpulsatile blood flow and easy flushing noted in all ports.  The catheter was sutured in place and sterile dressing applied. ? ?Battery expired before [place] image could be saved ? ?Complications/Tolerance ?None; patient tolerated the procedure well. ?Chest X-ray is ordered to verify placement for internal jugular or subclavian cannulation.  Chest x-ray is not ordered for femoral cannulation. ? ?EBL ?Minimal ? ?Specimen(s) ?None  ?

## 2022-05-12 NOTE — Consult Note (Signed)
?  ?Advanced Heart Failure Team Consult Note ? ? ?Primary Physician: Sharilyn Sites, MD ?PCP-Cardiologist:  Carlyle Dolly, MD ? ?Reason for Consultation: RV failure ? ?HPI:   ? ?Erin Cooper is seen today for evaluation of RV failure at the request of Dr. Audie Box.  ? ?71 y.o. 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  She had prior ischemic evaluation in 3/22 for atypical chest pain by Dr. Harl Bowie, had normal Cardiolite at that time.  ? ?Patient has a long history of exertional dyspnea attributed to COPD.  She has smoked > 1 ppd x years but is not on home oxygen.  Patient reports approximately 2 wks of nausea/weakness and presyncope.  On 5/12, she had 2 syncopal episodes with exertion and ended up in the ER.  She denies chest pain.  She was hypoxemic and hypotensive, 63/52 initially.  Lactate 8.7, creatinine 1.8, HS-TnI > 24K.  ECG showed junctional rhythm with no STE elevation, initially with iRBBB, later with RVH versus old posterior MI.  Echo was done and I reviewed, this showed EF 65-70% with no WMAs, moderate LVH, severe RV dilation with moderate RV dysfunction and McConnell's sign, severe TR (not sure PASP accurate) with dilated IVC.  She underwent CTA chest showing no PE, emphysema, some calcified subcarinal lymph nodes noted.   ? ?She was started on NE + dobutamine 2.5.  CVL placed.  Co-ox this morning 40% on dobutamine 2.5 and NE 15, creatinine up to 2.93 with rising LFTs.  Lactate lower at 4.2. She is not short of breath at rest.  Still feels weak and nauseated. CVP 19-20.  ? ? ?Review of Systems: All systems reviewed and negative except as per HPI.  ? ?Home Medications ?Prior to Admission medications   ?Medication Sig Start Date End Date Taking? Authorizing Provider  ?albuterol (PROVENTIL) (2.5 MG/3ML) 0.083% nebulizer  solution Take 2.5 mg by nebulization as needed for wheezing or shortness of breath.   Yes [provider]  ?amitriptyline (ELAVIL) 25 MG tablet Take 1-2 tablets by mouth at bedtime. 07/28/19  Yes [provider]  ?amLODipine (NORVASC) 2.5 MG tablet Take 2.5 mg by mouth every morning. 01/20/21  Yes [provider]  ?aspirin EC 81 MG tablet Take 81 mg by mouth daily.   Yes [provider]  ?diazepam (VALIUM) 5 MG tablet Take 1 tablet by mouth 3 (three) times daily as needed for muscle spasms or anxiety. 07/09/19  Yes [provider]  ?latanoprost (XALATAN) 0.005 % ophthalmic solution Place 1 drop into both eyes at bedtime.   Yes [provider]  ?losartan (COZAAR) 100 MG tablet Take 100 mg by mouth daily.  03/24/14  Yes [provider]  ?naproxen (NAPROSYN) 250 MG tablet Take 250 mg by mouth 2 (two) times daily as needed for mild pain or headache.   Yes [provider]  ?oxcarbazepine (TRILEPTAL) 600 MG tablet Take 300 mg by mouth 2 (two) times daily.   Yes [provider]  ?SYNTHROID 88 MCG tablet Take 88 mcg by mouth daily before breakfast.  04/26/13  Yes [provider]  ?VITAMIN D PO Take 2,000 Units by mouth daily.   Yes [provider]  ?doxycycline (VIBRAMYCIN) 100 MG capsule Take 1 capsule (100 mg total) by mouth 2 (two) times daily. ?Patient not taking:  Reported on 05/07/2022 04/30/22   Volney American, PA-C  ?meloxicam (MOBIC) 7.5 MG tablet Take 7.5 mg by mouth daily. ?Patient not taking: Reported on 05/25/2022 03/07/21   [provider]  ? ? ?Past Medical History: ?Past Medical History:  ?Diagnosis Date  ? Cancer [facility])   ? Uterine  ? Chronic back pain   ? Chronic neck pain   ? COPD (chronic obstructive pulmonary disease) ([place])   ? Coronary atherosclerosis of native coronary artery   ? Possible diagnosis - details not complete  ? Essential hypertension, benign   ? History of MRSA infection   ? History of  uterine cancer   ? Age 36  ? Seizures ([place])   ? ? ? ?Past Surgical History: ?Past Surgical History:  ?Procedure Laterality Date  ? APPENDECTOMY  2000  ? BACK SURGERY    ? x2  ? CHOLECYSTECTOMY  2001  ? COLONOSCOPY N/A 05/12/2015  ? Procedure: COLONOSCOPY;  Surgeon: Rogene Houston, MD;  Location: [facility];  Service: Endoscopy;  Laterality: N/A;  1220 - moved to 10:25 - Ann to notify pt  ? NECK SURGERY    ? x2  ? PARTIAL HYSTERECTOMY    ? Age 53, uterine cancer  ? THYMECTOMY    ? 11/06/2009, Dr Servando Snare   ? THYROID SURGERY    ? TUMOR EXCISION    ? Chest  ? ? ?Family History: ?Family History  ?Problem Relation Age of Onset  ? Lung cancer Mother   ? Heart failure Father   ? Cirrhosis Father   ? Breast cancer Daughter   ?     in her 41s  ? ? ?Social History: ?Social History  ? ?Socioeconomic History  ? Marital status: Widowed  ?  Spouse name: Not on file  ? Number of children: Not on file  ? Years of education: Not on file  ? Highest education level: Not on file  ?Occupational History  ? Occupation: Disabled  ?Tobacco Use  ? Smoking status: Every Day  ?  Packs/day: 0.50  ?  Types: Cigarettes  ?  Start date: 03/05/1974  ? Smokeless tobacco: Never  ? Tobacco comments:  ?  1/2 pack a day since age 8  ?Vaping Use  ? Vaping Use: Never used  ?Substance and Sexual Activity  ? Alcohol use: No  ?  Alcohol/week: 0.0 standard drinks  ? Drug use: No  ? Sexual activity: Not Currently  ?  Birth control/protection: None  ?Other Topics Concern  ? Not on file  ?Social History Narrative  ? Married with 3 children  ? ?Social Determinants of Health  ? ?Financial Resource Strain: Not on file  ?Food Insecurity: Not on file  ?Transportation Needs: Not on file  ?Physical Activity: Not on file  ?Stress: Not on file  ?Social Connections: Not on file  ? ? ?Allergies:  ?Allergies  ?Allergen Reactions  ? Codeine Shortness Of Breath  ? Morphine Shortness Of Breath  ? Penicillins Shortness Of Breath  ? Levothyroxine Sodium Other (See Comments)  ?   Generic synthroid caused altered mental   ? ? ?Objective:   ? ?Vital Signs:   ?Temp:  [96.4 ?F (35.8 ?C)-98.6 ?F (37 ?C)] 97.8 ?F (36.6 ?C) (05/14 0000) ?Pulse Rate:  [58-106] 64 (05/14 0730) ?Resp:  [14-32] 22 (05/14 0745) ?BP: (55-144)/(33-113) 86/67 (05/14 0745) ?SpO2:  [78 %-100 %] 97 % (05/14 0745) ?Weight:  [63.5 kg-70 kg] 70 kg (05/14 0500) ?Last BM Date : 05/10/22 ? ?  Weight change: ?Filed Weights  ? 05/29/2022 0905 05/12/22 0500  ?Weight: 63.5 kg 70 kg  ? ? ?Intake/Output:  ? ?Intake/Output Summary (Last 24 hours) at 05/12/2022 0836 ?Last data filed at 05/12/2022 0834 ?Gross per 24 hour  ?Intake 1202.4 ml  ?Output 2 ml  ?Net 1200.4 ml  ?  ? ? ?Physical Exam  ?  ?General:  Well appearing. No resp difficulty ?HEENT: normal ?Neck: supple. JVP 16+. Carotids 2+ bilat; no bruits. No lymphadenopathy or thyromegaly appreciated. ?Cor: PMI nondisplaced. Regular rate & rhythm. 2/6 HSM LLSB.  ?Lungs: clear ?Abdomen: soft, nontender, nondistended. No hepatosplenomegaly. No bruits or masses. Good bowel sounds. ?Extremities: no cyanosis, clubbing, rash, edema ?Neuro: alert & orientedx3, cranial nerves grossly intact. moves all 4 extremities w/o difficulty. Affect pleasant ? ? ?Telemetry  ? ?Accelerated junctional rhythm in 60s (personally reviewed) ? ?EKG  ?  ?Accelerated junctional rhythm, initially with iRBBB but most recent ECGs with RVH versus old posterior MI.  No STE.  Personally reviewed.  ? ?Labs  ? ?Basic Metabolic Panel: ?Recent Labs  ?Lab 05/19/2022 ?0912 05/22/2022 ?0932 05/12/22 ?0429  ?NA 132* 131* 128*  ?K 3.7 3.8 4.5  ?CL 101 100 99  ?CO2 18*  --  17*  ?GLUCOSE 152* 139* 150*  ?BUN 22 22 30*  ?CREATININE 1.87* 1.80* 2.93*  ?CALCIUM 8.3*  --  7.9*  ?MG  --   --  2.0  ? ? ?Liver Function Tests: ?Recent Labs  ?Lab 05/18/2022 ?0912 05/12/22 ?0429  ?AST 111* 899*  ?ALT 65* 760*  ?ALKPHOS 64 77  ?BILITOT 0.6 1.1  ?PROT 6.0* 5.2*  ?ALBUMIN 3.7 3.2*  ? ?Recent Labs  ?Lab 05/01/2022 ?1108  ?LIPASE 31  ? ?No results for  input(s): AMMONIA in the last 168 hours. ? ?CBC: ?Recent Labs  ?Lab 05/01/2022 ?0912 05/21/2022 ?0932 05/12/22 ?0429  ?WBC 10.5  --  9.0  ?HGB 12.4 12.9 11.3*  ?HCT 36.7 38.0 34.2*  ?MCV 99.7  --  100.0  ?PLT 199

## 2022-05-12 NOTE — Progress Notes (Signed)
?  Interdisciplinary Goals of Care Family Meeting ? ? ?Date carried out:: 05/12/2022 ? ?Location of the meeting: Bedside ? ?Member's involved: Physician, Bedside Registered Nurse, and Family Member or next of kin ? ?Durable Power of Tour manager: Brother   ? ?Discussion: We discussed goals of care for Erin Cooper. Having just had an arrest and given her high pressor requirement she would not survive another one. We discussed this. We are in agreement that she should be made DNR.   ? ?Code status: Full DNR ? ?Disposition: Continue current acute care ? ? ?Time spent for the meeting: 10 minutes ? ?[name] ?05/12/2022, 5:06 PM ? ?

## 2022-05-12 NOTE — Consult Note (Signed)
[facility] ?Renal Consultation Note ? ?Requesting MD: Loralie Champagne, MD ?Indication for Consultation:  AKI ? ?Chief complaint: recurrent syncope and weakness ? ?HPI: ?Erin Cooper is a 71 y.o. female with a history of uterine cancer COPD coronary artery disease and hypertension who presented to the hospital with recurrent syncopal episodes at home as well as nausea and weakness.  She was found to be in cardiogenic shock.  She has been initiated on dobutamine as well as levo which is now at 27 mcg/min.  She had a trial of Lasix today and has not produced much urine output.  Pulmonary is concerned about her respiratory status.  She has experienced AKI with Cr rising from a baseline of 1 to 1.87 on 05/23/2022 and to 2.94 on 05/12/22.  Note that she did get contrast on 5/13 for a CT angio.  Note that home meds include naproxen.  ? ?CHF team feels that she is acceptable for trial of CRRT but would not be a long-term hemodialysis candidate.  She is having an arterial line as well as a nontunneled catheter for hemodialysis placed.  She just got some sedation for the procedure.  She is able to tell me that she spoke with a team earlier about dialysis.  She is sleepy but confirms to me that she would want dialysis short term but would not want it long-term which is consistent with what the team reports to me as well.  I met with her brother and daughters in the lobby and we discussed risks/benefits/indications for dialysis.  They were present for her discussion with the team earlier and confirmed that she would want short term but not long-term dialysis.  They provided consent for dialysis.  ? ? ? ?Creatinine, Ser  ?Date/Time Value Ref Range Status  ?05/12/2022 04:29 AM 2.93 (H) 0.44 - 1.00 mg/dL Final  ?05/02/2022 09:32 AM 1.80 (H) 0.44 - 1.00 mg/dL Final  ?05/15/2022 09:12 AM 1.87 (H) 0.44 - 1.00 mg/dL Final  ?03/16/2021 10:05 AM 0.97 0.44 - 1.00 mg/dL Final  ?06/06/2020 02:25 PM 0.78 0.44 - 1.00 mg/dL Final   ?04/23/2017 12:10 PM 0.70 0.44 - 1.00 mg/dL Final  ?07/09/2010 05:30 AM 0.87 0.4 - 1.2 mg/dL Final  ?07/08/2010 05:25 AM 1.02 0.4 - 1.2 mg/dL Final  ?07/07/2010 05:00 AM 1.02 0.4 - 1.2 mg/dL Final  ?07/06/2010 08:35 PM 1.0 0.4 - 1.2 mg/dL Final  ?07/06/2010 05:18 PM 1.05 0.4 - 1.2 mg/dL Final  ?06/27/2010 11:36 PM 0.9 0.4 - 1.2 mg/dL Final  ?06/27/2010 09:09 PM 0.91 0.4 - 1.2 mg/dL Final  ?11/08/2009 04:20 AM 0.44 0.4 - 1.2 mg/dL Final  ?11/07/2009 04:03 AM 0.42 0.4 - 1.2 mg/dL Final  ?11/01/2009 02:08 PM 0.59 0.4 - 1.2 mg/dL Final  ?10/31/2009 11:13 AM 0.46 mg/dL   ? ? ? ?PMHx: ?  ?Past Medical History:  ?Diagnosis Date  ? Cancer [facility])   ? Uterine  ? Chronic back pain   ? Chronic neck pain   ? COPD (chronic obstructive pulmonary disease) ([place])   ? Coronary atherosclerosis of native coronary artery   ? Possible diagnosis - details not complete  ? Essential hypertension, benign   ? History of MRSA infection   ? History of uterine cancer   ? Age 53  ? Seizures ([place])   ? ? ?Past Surgical History:  ?Procedure Laterality Date  ? APPENDECTOMY  2000  ? BACK SURGERY    ? x2  ? CHOLECYSTECTOMY  2001  ? COLONOSCOPY N/A 05/12/2015  ?  Procedure: COLONOSCOPY;  Surgeon: Rogene Houston, MD;  Location: [facility];  Service: Endoscopy;  Laterality: N/A;  1220 - moved to 10:25 - Ann to notify pt  ? NECK SURGERY    ? x2  ? PARTIAL HYSTERECTOMY    ? Age 62, uterine cancer  ? THYMECTOMY    ? 11/06/2009, Dr Servando Snare   ? THYROID SURGERY    ? TUMOR EXCISION    ? Chest  ? ? ?Family Hx:  ?Family History  ?Problem Relation Age of Onset  ? Lung cancer Mother   ? Heart failure Father   ? Cirrhosis Father   ? Breast cancer Daughter   ?     in her 96s  ? ? ?Social History:  reports that she has been smoking cigarettes. She started smoking about 48 years ago. She has been smoking an average of .5 packs per day. She has never used smokeless tobacco. She reports that she does not drink alcohol and does not use drugs. ? ?Allergies:  ?Allergies   ?Allergen Reactions  ? Codeine Shortness Of Breath  ? Morphine Shortness Of Breath  ? Penicillins Shortness Of Breath  ? Levothyroxine Sodium Other (See Comments)  ?  Generic synthroid caused altered mental   ? ? ?Medications: ?Prior to Admission medications   ?Medication Sig Start Date End Date Taking? Authorizing Provider  ?albuterol (PROVENTIL) (2.5 MG/3ML) 0.083% nebulizer solution Take 2.5 mg by nebulization as needed for wheezing or shortness of breath.   Yes [provider]  ?amitriptyline (ELAVIL) 25 MG tablet Take 1-2 tablets by mouth at bedtime. 07/28/19  Yes [provider]  ?amLODipine (NORVASC) 2.5 MG tablet Take 2.5 mg by mouth every morning. 01/20/21  Yes [provider]  ?aspirin EC 81 MG tablet Take 81 mg by mouth daily.   Yes [provider]  ?diazepam (VALIUM) 5 MG tablet Take 1 tablet by mouth 3 (three) times daily as needed for muscle spasms or anxiety. 07/09/19  Yes [provider]  ?latanoprost (XALATAN) 0.005 % ophthalmic solution Place 1 drop into both eyes at bedtime.   Yes [provider]  ?losartan (COZAAR) 100 MG tablet Take 100 mg by mouth daily.  03/24/14  Yes [provider]  ?naproxen (NAPROSYN) 250 MG tablet Take 250 mg by mouth 2 (two) times daily as needed for mild pain or headache.   Yes [provider]  ?oxcarbazepine (TRILEPTAL) 600 MG tablet Take 300 mg by mouth 2 (two) times daily.   Yes [provider]  ?SYNTHROID 88 MCG tablet Take 88 mcg by mouth daily before breakfast.  04/26/13  Yes [provider]  ?VITAMIN D PO Take 2,000 Units by mouth daily.   Yes [provider]  ?doxycycline (VIBRAMYCIN) 100 MG capsule Take 1 capsule (100 mg total) by mouth 2 (two) times daily. ?Patient not taking: Reported on 05/09/2022 04/30/22   Volney American, PA-C  ?meloxicam (MOBIC) 7.5 MG tablet Take 7.5 mg by mouth daily. ?Patient not taking: Reported on 05/06/2022 03/07/21   [provider]  ? ? I have reviewed the patient's current and reported prior to admission medications. ? ?Labs:  ? ?  Latest Ref Rng & Units 05/12/2022  ?  4:29 AM 05/17/2022  ?  9:32 AM 05/12/2022  ?  9:12 AM  ?BMP  ?Glucose 70 - 99 mg/dL 150   139   152    ?BUN 8 - 23 mg/dL _0 ?  Creatinine 0.44 - 1.00 mg/dL 2.93   1.80   1.87    ?Sodium 135 - 145 mmol/L 128   131   132    ?Potassium 3.5 - 5.1 mmol/L 4.5   3.8   3.7    ?Chloride 98 - 111 mmol/L 99   100   101    ?CO2 22 - 32 mmol/L 17    18    ?Calcium 8.9 - 10.3 mg/dL 7.9    8.3    ? ? ?Urinalysis ?   ?Component Value Date/Time  ? COLORURINE YELLOW 11/01/2009 1408  ? APPEARANCEUR CLEAR 11/01/2009 1408  ? LABSPEC 1.013 11/01/2009 1408  ? PHURINE 5.5 11/01/2009 1408  ? GLUCOSEU NEGATIVE 11/01/2009 1408  ? [place] NEGATIVE 11/01/2009 1408  ? [place] NEGATIVE 11/01/2009 1408  ? [place] NEGATIVE 11/01/2009 1408  ? PROTEINUR NEGATIVE 11/01/2009 1408  ? UROBILINOGEN 0.2 11/01/2009 1408  ? NITRITE NEGATIVE 11/01/2009 1408  ? LEUKOCYTESUR  11/01/2009 1408  ?  NEGATIVE MICROSCOPIC NOT DONE ON URINES WITH NEGATIVE PROTEIN, BLOOD, LEUKOCYTES, NITRITE, OR GLUCOSE <1000 mg/dL.  ? ? ? ?ROS: ? ?Pertinent items noted in HPI and remainder of comprehensive ROS otherwise negative. ? ?Physical Exam: ?Vitals:  ? 05/12/22 1245 05/12/22 1300  ?BP: 126/78 (!) 97/53  ?Pulse: (!) 105 73  ?Resp: (!) 29 (!) 26  ?Temp:    ?SpO2: 99% 100%  ?   ?General: adult female in bed critically ill   ?HEENT: NCAT ?Eyes: EOMI sclera anicteric ?Neck: supple trachea midline ?Heart:  S1S2 no rub; tachy ?Lungs: reduced breath sounds on auscultation; supplemental oxygen in use ?Abdomen: distended but not tender ?Extremities: trace pitting edema  ?Skin: no rash on extremities exposed but there are drapes in place for a procedure ?Neuro: she is sleepy s/p sedation for a procedure ? ?Assessment/Plan: ? ?# AKI  ?- Secondary to ischemic and pre-renal insults in the setting of cardiogenic shock.  She has  also received contrast (CT angio on 5/13).   ?- Starting CRRT  ?- 4K bags  ?- Starting with net negative 50 to 100 ml/hr   ?- Critical care is placing a line  ?- Note that she has expressed that she would accept

## 2022-05-12 NOTE — Progress Notes (Signed)
At 16:20 CVVHDF CRRT was initiated and pt became hypotensive, pulseless and unresponsive with SB rate in the 30's. CRRT was stopped and CPR and ACLS protocol initiated at 16:24, see code sheet for medications administered. ROSC returned at 16:26. Pt was intubated afterwards at 16:38. ?

## 2022-05-12 NOTE — Progress Notes (Addendum)
eLink Physician-Brief Progress Note ?Patient Name: Erin Cooper ?DOB: Sep 28, 1951 ?MRN: [id number] ? ? ?Date of Service ? 05/12/2022  ?HPI/Events of Note ? Received query from bedside RN regarding lasix.  ? ?71/F in shock secondary to right sided heart failure, had a brief arrest on CRRT.  Epinephrine was started, continued on levophed and vasopressin.  dobutamine turned off.   ? ?On camera assessment, pt was only on epinephrine gtt.  Rest were able to be weaned off.   ?eICU Interventions ? Restart dobutamine and levophed.  Titrate epinephrine if able to maintain MAP >65.  Hold off on lasix '120mg'$  IV.    ? ? ? ?Intervention Category ?Intermediate Interventions: Other: ? ?Elsie Lincoln ?05/12/2022, 7:59 PM ? ?8:56 PM ?Pt is on 2 of epi, 7.5 dobutamine, 15 levo and fentanyl 12.5.  ?Pt is waking up some.  ABG with pH 7.32, lactate improving from 7.7 --> 4.4.   ? ?Plan> ?Keep dobutamine at 5 mcg/kg/min, increase fentanyl as needed. Continue levophed and epi.  ?Insert OGT and check KUB for placement.  ? ?10:10 PM ?Pt with acute drop in O2 saturations.  ?OGT was difficult to insert and was removed.  ?Pt is awake and alert, able to follow simple commands.  ? ?Plan> ?Check ABG now.  Get CXR.  ?Hold dobutamine for now given hypotension and increasing levophed and epi requirements.  ? ?12:53 AM ?RN called cardiology fellow and as per recommendation to restart dobutamine at 72mg/kg/min.  Pt on max of levophed and epi with MAP only at 55.  ? ?Plan> ?Restart vasopressin.  ?Increase max doses of levophed and epinephrine.   ?Dobutamine at 3. ? ?3:43 AM ?Notified that patient is restless and is now on fentanyl gtt at 1075m/hr.  Pt drops her blood pressures when she gets fentanyl boluses.  ? ?Plan> ?Trial of versed prn bolus. ? ?4:57 AM ?Pt fell asleep and did not need versed bolus.  ? ?ABG now showing metabolic acidosis with pH 7.071/27.9/100. ? ?Plan> ?Give HCO3 10060mIV push and start on HCO3 gtt.  ? ?5:18 AM ?Advised family member who  was in the room to call family members in. Pt is maxed on epinephrine and levophed, and is on vasopressin and dobutamine and yet with BP in the 90s. Pt also acidotic and started on HCO3 gtt. Pt started to brady but HR went back into the 90s. ? ?Plan> ?Continue current management. Prognosis remains grim.  ? ?

## 2022-05-12 NOTE — Progress Notes (Addendum)
ANTICOAGULATION CONSULT NOTE ? ?Pharmacy Consult for heparin gtt  ?Indication: chest pain/ACS ? ?Allergies  ?Allergen Reactions  ? Codeine Shortness Of Breath  ? Morphine Shortness Of Breath  ? Penicillins Shortness Of Breath  ? Levothyroxine Sodium Other (See Comments)  ?  Generic synthroid caused altered mental   ? ? ?Patient Measurements: ?Height: '5\' 4"'$  (162.6 cm) ?Weight: 70 kg (154 lb 5.2 oz) ?IBW/kg (Calculated) : 54.7 ?Heparin Dosing Weight: HEPARIN DW (KG): 63.5 ? ? ?Vital Signs: ?BP: 103/71 (05/14 1700) ?Pulse Rate: 63 (05/14 1620) ? ?Labs: ?Recent Labs  ?  05/16/2022 ?0912 05/19/2022 ?0932 05/16/2022 ?1856 05/12/22 ?0429 05/12/22 ?6160 05/12/22 ?1555 05/12/22 ?1635  ?HGB 12.4 12.9  --  11.3*  --   --  9.3*  ?HCT 36.7 38.0  --  34.2*  --   --  27.7*  ?PLT 199  --   --  118*  --   --  88*  ?APTT  --   --   --   --   --   --  92*  ?LABPROT  --   --   --   --  26.6*  --   --   ?INR  --   --   --   --  2.5*  --   --   ?HEPARINUNFRC  --   --  0.35 0.21*  --  0.12*  --   ?CREATININE 1.87* 1.80*  --  2.93*  --  3.70*  --   ?CKTOTAL  --   --   --  1,346*  --   --   --   ? ? ? ?Estimated Creatinine Clearance: 13 mL/min (A) (by C-G formula based on SCr of 3.7 mg/dL (H)). ? ? ?Medical History: ?Past Medical History:  ?Diagnosis Date  ? Cancer [facility])   ? Uterine  ? Chronic back pain   ? Chronic neck pain   ? COPD (chronic obstructive pulmonary disease) ([place])   ? Coronary atherosclerosis of native coronary artery   ? Possible diagnosis - details not complete  ? Essential hypertension, benign   ? History of MRSA infection   ? History of uterine cancer   ? Age 55  ? Seizures ([place])   ? ? ?Medications:  ?Medications Prior to Admission  ?Medication Sig Dispense Refill Last Dose  ? albuterol (PROVENTIL) (2.5 MG/3ML) 0.083% nebulizer solution Take 2.5 mg by nebulization as needed for wheezing or shortness of breath.   unk  ? amitriptyline (ELAVIL) 25 MG tablet Take 1-2 tablets by mouth at bedtime.   Past Week  ? amLODipine (NORVASC)  2.5 MG tablet Take 2.5 mg by mouth every morning.   05/18/2022  ? aspirin EC 81 MG tablet Take 81 mg by mouth daily.   Past Week  ? diazepam (VALIUM) 5 MG tablet Take 1 tablet by mouth 3 (three) times daily as needed for muscle spasms or anxiety.   unk  ? latanoprost (XALATAN) 0.005 % ophthalmic solution Place 1 drop into both eyes at bedtime.   Past Week  ? losartan (COZAAR) 100 MG tablet Take 100 mg by mouth daily.    04/29/2022  ? naproxen (NAPROSYN) 250 MG tablet Take 250 mg by mouth 2 (two) times daily as needed for mild pain or headache.   Past Month  ? oxcarbazepine (TRILEPTAL) 600 MG tablet Take 300 mg by mouth 2 (two) times daily.   05/22/2022  ? SYNTHROID 88 MCG tablet Take 88 mcg by mouth daily before breakfast.  05/12/2022  ? VITAMIN D PO Take 2,000 Units by mouth daily.   Past Week  ? doxycycline (VIBRAMYCIN) 100 MG capsule Take 1 capsule (100 mg total) by mouth 2 (two) times daily. (Patient not taking: Reported on 05/15/2022) 20 capsule 0 Completed Course  ? meloxicam (MOBIC) 7.5 MG tablet Take 7.5 mg by mouth daily. (Patient not taking: Reported on 05/23/2022)   Not Taking  ? ?Scheduled:  ? atorvastatin  40 mg Oral Daily  ? Chlorhexidine Gluconate Cloth  6 each Topical Q0600  ? docusate  100 mg Per Tube BID  ? etomidate      ? fentaNYL      ? fentaNYL (SUBLIMAZE) injection  25 mcg Intravenous Once  ? [START ON 05/16/22] levothyroxine  88 mcg Per Tube Q0600  ? melatonin  3 mg Oral QHS  ? midazolam      ? OXcarbazepine  150 mg Oral BID  ? polyethylene glycol  17 g Per Tube Daily  ? revefenacin  175 mcg Nebulization Daily  ? rocuronium bromide      ? sodium bicarbonate      ? succinylcholine      ? ?Infusions:  ?  prismasol BGK 4/2.5    ?  prismasol BGK 4/2.5    ? sodium chloride Stopped (05/12/22 1020)  ? sodium chloride    ? [START ON 05-16-22] ceFEPime (MAXIPIME) IV    ? DOBUTamine 7.5 mcg/kg/min (05/12/22 1535)  ? epinephrine    ? fentaNYL infusion INTRAVENOUS    ? furosemide Stopped (05/12/22 1111)  ?  heparin 850 Units/hr (05/12/22 1535)  ? norepinephrine (LEVOPHED) '16mg'$  / 212m infusion 32 mcg/min (05/12/22 1535)  ? prismasol BGK 4/2.5    ? vasopressin 0.04 Units/min (05/12/22 1704)  ? ?PRN:  ?Anti-infectives (From admission, onward)  ? ? Start     Dose/Rate Route Frequency Ordered Stop  ? 005/18/20231000  ceFEPIme (MAXIPIME) 2 g in sodium chloride 0.9 % 100 mL IVPB       ? 2 g ?200 mL/hr over 30 Minutes Intravenous Every 12 hours 05/12/22 1537    ? 05/12/22 1100  vancomycin (VANCOREADY) IVPB 750 mg/150 mL  Status:  Discontinued       ? 750 mg ?150 mL/hr over 60 Minutes Intravenous Every 24 hours 05/01/2022 1238 05/16/2022 1848  ? 05/12/22 1000  ceFEPIme (MAXIPIME) 2 g in sodium chloride 0.9 % 100 mL IVPB  Status:  Discontinued       ? 2 g ?200 mL/hr over 30 Minutes Intravenous Every 24 hours 05/10/2022 1238 05/12/22 1537  ? 05/12/2022 1030  ceFEPIme (MAXIPIME) 2 g in sodium chloride 0.9 % 100 mL IVPB       ? 2 g ?200 mL/hr over 30 Minutes Intravenous  Once 04/29/2022 1020 05/05/2022 1224  ? 05/05/2022 1030  metroNIDAZOLE (FLAGYL) IVPB 500 mg       ? 500 mg ?100 mL/hr over 60 Minutes Intravenous  Once 05/28/2022 1020 05/09/2022 1231  ? 05/18/2022 1030  vancomycin (VANCOCIN) IVPB 1000 mg/200 mL premix       ? 1,000 mg ?200 mL/hr over 60 Minutes Intravenous  Once 05/09/2022 1020 05/28/2022 1224  ? ?  ? ? ?Assessment: ?Erin MERTENSa 71y.o. female requires anticoagulation with a heparin iv infusion for the indication of  chest pain/ACS. No anticoagulants noted PTA ?-heparin level = 0.12 ?-hg= 9.3, plt= 88 ?-pt just had a brief arrest  ? ?Goal of Therapy:  ?Heparin level 0.3-0.7 units/ml ?Monitor platelets  by anticoagulation protocol: Yes ?  ?Plan:  ?-Increase heparin to 1000 units/hr ?-recheck heparin level in 8 hrs ?-Daily heparin level and CBC ?-CRRT stopped for now; change cefepime to 2gm IV q24h ? ?Hildred Laser, PharmD ?Clinical Pharmacist ?**Pharmacist phone directory can now be found on [email or web address] ([email or web address]).  Listed under [place]. ? ? ? ? ?

## 2022-05-13 DIAGNOSIS — R579 Shock, unspecified: Secondary | ICD-10-CM | POA: Diagnosis not present

## 2022-05-13 LAB — LACTIC ACID, PLASMA: Lactic Acid, Venous: >9 mmol/L (ref 0.5–1.9)

## 2022-05-13 LAB — CBC
HCT: 25.1 % — ABNORMAL LOW (ref 36.0–46.0)
Hemoglobin: 8 g/dL — ABNORMAL LOW (ref 12.0–15.0)
MCH: 33.6 pg (ref 26.0–34.0)
MCHC: 31.9 g/dL (ref 30.0–36.0)
MCV: 105.5 fL — ABNORMAL HIGH (ref 80.0–100.0)
Platelets: 67 10*3/uL — ABNORMAL LOW (ref 150–400)
RBC: 2.38 MIL/uL — ABNORMAL LOW (ref 3.87–5.11)
RDW: 14.2 % (ref 11.5–15.5)
WBC: 9.2 10*3/uL (ref 4.0–10.5)
nRBC: 0.4 % — ABNORMAL HIGH (ref 0.0–0.2)

## 2022-05-13 LAB — COMPREHENSIVE METABOLIC PANEL
ALT: 5408 U/L — ABNORMAL HIGH (ref 0–44)
AST: 5539 U/L — ABNORMAL HIGH (ref 15–41)
Albumin: 3 g/dL — ABNORMAL LOW (ref 3.5–5.0)
Alkaline Phosphatase: 82 U/L (ref 38–126)
Anion gap: 26 — ABNORMAL HIGH (ref 5–15)
BUN: 41 mg/dL — ABNORMAL HIGH (ref 8–23)
CO2: 8 mmol/L — ABNORMAL LOW (ref 22–32)
Calcium: 8.1 mg/dL — ABNORMAL LOW (ref 8.9–10.3)
Chloride: 96 mmol/L — ABNORMAL LOW (ref 98–111)
Creatinine, Ser: 4.83 mg/dL — ABNORMAL HIGH (ref 0.44–1.00)
GFR, Estimated: 9 mL/min — ABNORMAL LOW (ref >60)
Glucose, Bld: 88 mg/dL (ref 70–99)
Potassium: 4.9 mmol/L (ref 3.5–5.1)
Sodium: 130 mmol/L — ABNORMAL LOW (ref 135–145)
Total Bilirubin: 2.2 mg/dL — ABNORMAL HIGH (ref 0.3–1.2)
Total Protein: 5.1 g/dL — ABNORMAL LOW (ref 6.5–8.1)

## 2022-05-13 LAB — COOXEMETRY PANEL
Carboxyhemoglobin: 0.4 % — ABNORMAL LOW (ref 0.5–1.5)
Methemoglobin: <0.7 % (ref 0.0–1.5)
O2 Saturation: 59.7 %
Total hemoglobin: 9.5 g/dL — ABNORMAL LOW (ref 12.0–16.0)

## 2022-05-13 LAB — POCT I-STAT 7, (LYTES, BLD GAS, ICA,H+H)
Acid-base deficit: 21 mmol/L — ABNORMAL HIGH (ref 0.0–2.0)
Bicarbonate: 8.3 mmol/L — ABNORMAL LOW (ref 20.0–28.0)
Calcium, Ion: 1.06 mmol/L — ABNORMAL LOW (ref 1.15–1.40)
HCT: 26 % — ABNORMAL LOW (ref 36.0–46.0)
Hemoglobin: 8.8 g/dL — ABNORMAL LOW (ref 12.0–15.0)
O2 Saturation: 94 %
Patient temperature: 35.6
Potassium: 4.7 mmol/L (ref 3.5–5.1)
Sodium: 130 mmol/L — ABNORMAL LOW (ref 135–145)
TCO2: 9 mmol/L — ABNORMAL LOW (ref 22–32)
pCO2 arterial: 27.9 mmHg — ABNORMAL LOW (ref 32–48)
pH, Arterial: 7.071 — CL (ref 7.35–7.45)
pO2, Arterial: 92 mmHg (ref 83–108)

## 2022-05-13 LAB — PHOSPHORUS: Phosphorus: 11.7 mg/dL — ABNORMAL HIGH (ref 2.5–4.6)

## 2022-05-13 LAB — HEPARIN LEVEL (UNFRACTIONATED): Heparin Unfractionated: 0.4 IU/mL (ref 0.30–0.70)

## 2022-05-13 LAB — MAGNESIUM: Magnesium: 2.3 mg/dL (ref 1.7–2.4)

## 2022-05-13 MED ORDER — MIDAZOLAM BOLUS VIA INFUSION (WITHDRAWAL LIFE SUSTAINING TX)
2.0000 mg | INTRAVENOUS | Status: DC | PRN
  Administered 2022-05-13 (×2): 2 mg via INTRAVENOUS
  Filled 2022-05-13: qty 2

## 2022-05-13 MED ORDER — DEXTROSE 5 % IV SOLN: INTRAVENOUS | Status: DC

## 2022-05-13 MED ORDER — STERILE WATER FOR INJECTION IV SOLN
INTRAVENOUS | Status: DC
  Filled 2022-05-13: qty 1000

## 2022-05-13 MED ORDER — POLYVINYL ALCOHOL 1.4 % OP SOLN: 1.0000 [drp] | Freq: Four times a day (QID) | OPHTHALMIC | Status: DC | PRN

## 2022-05-13 MED ORDER — MIDAZOLAM-SODIUM CHLORIDE 100-0.9 MG/100ML-% IV SOLN
0.0000 mg/h | INTRAVENOUS | Status: DC
  Administered 2022-05-13: 2 mg/h via INTRAVENOUS

## 2022-05-13 MED ORDER — GLYCOPYRROLATE 1 MG PO TABS
1.0000 mg | ORAL_TABLET | ORAL | Status: DC | PRN
  Filled 2022-05-13: qty 1

## 2022-05-13 MED ORDER — ACETAMINOPHEN 325 MG PO TABS: 650.0000 mg | ORAL_TABLET | Freq: Four times a day (QID) | ORAL | Status: DC | PRN

## 2022-05-13 MED ORDER — MIDAZOLAM-SODIUM CHLORIDE 100-0.9 MG/100ML-% IV SOLN
INTRAVENOUS | Status: AC
  Filled 2022-05-13: qty 100

## 2022-05-13 MED ORDER — GLYCOPYRROLATE 0.2 MG/ML IJ SOLN: 0.2000 mg | INTRAMUSCULAR | Status: DC | PRN

## 2022-05-13 MED ORDER — DIPHENHYDRAMINE HCL 50 MG/ML IJ SOLN: 25.0000 mg | INTRAMUSCULAR | Status: DC | PRN

## 2022-05-13 MED ORDER — MIDAZOLAM HCL 2 MG/2ML IJ SOLN
2.0000 mg | INTRAMUSCULAR | Status: DC | PRN
  Administered 2022-05-13: 2 mg via INTRAVENOUS
  Filled 2022-05-13 (×2): qty 2

## 2022-05-13 MED ORDER — MIDAZOLAM HCL 2 MG/2ML IJ SOLN: 1.0000 mg | INTRAMUSCULAR | Status: DC | PRN

## 2022-05-13 MED ORDER — SODIUM BICARBONATE 8.4 % IV SOLN
100.0000 meq | Freq: Once | INTRAVENOUS | Status: AC
  Administered 2022-05-13: 100 meq via INTRAVENOUS
  Filled 2022-05-13: qty 50
  Filled 2022-05-13: qty 100

## 2022-05-13 MED ORDER — ACETAMINOPHEN 650 MG RE SUPP: 650.0000 mg | Freq: Four times a day (QID) | RECTAL | Status: DC | PRN

## 2022-05-16 LAB — CULTURE, BLOOD (ROUTINE X 2)
Culture: NO GROWTH
Culture: NO GROWTH
Special Requests: ADEQUATE
Special Requests: ADEQUATE

## 2022-05-30 NOTE — Death Summary Note (Signed)
?DEATH SUMMARY  ? ?Patient Details  ?Name: Erin Cooper ?MRN: [id number] ?DOB: [birth year] ? ?Admission/Discharge Information  ? ?Admit Date:  05-20-22  ?Date of Death: Date of Death: 2022/05/22  ?Time of Death: Time of Death: 0810  ?Length of Stay: 2  ?Referring Physician: Sharilyn Sites, MD  ? ?Reason(s) for Hospitalization  ?Cardiogenic Shock ? ?Diagnoses  ?Preliminary cause of death: Acute on chronic Right sided heart failure (congestive heart failure) ? ?Secondary Diagnoses (including complications and co-morbidities):  ?Principal Problem: ?  Hypotension ?Active Problems: ?  RVF (right ventricular failure) ([place]) ?  Shock ([place]) ?Chronic kidney disease ?COPD/Emphysema ? ? ?Brief Hospital Course (including significant findings, care, treatment, and services provided and events leading to death)  ?Erin Cooper was a 71 y.o. year old female with history of uterine cancer, COPD, CAD, HTN, seizures, hypothyroid who presented to [facility] for multiple syncope episodes, nausea, weakness, episodic dyspnea. She presented to the [facility] ED and was found to be in cardiogenic vs septic shock as well as NSTEMI. Echocardiogram showed EF 65-70% with no WMAs, moderate LVH, severe RV dilation with moderate RV dysfunction and McConnell's sign, severe TR (not sure PASP accurate) with dilated IVC.  Initial concern for PE, but CTA chest did not show PE.  No LV wall motion abnormalities on echo to suggest large MI. She was admitted to [facility] 2H ICU and started on inotropic support. She was seen by cardiology and nephrology service. She was started on lasix gtt and subsequently started on trial of CRRT for volume removal. Soon after starting dialysis she had a cardiac arrest and had ROSC after 1 round of CPR. Critical Care met with the family who elected to make her comfort care. She had subsequent clinical decline and the morning of May 23, 2023 they elected to make her full comfort care with compassionate extubation she expired 8:10 am.  ? ?Pertinent Labs  and Studies  ?Significant Diagnostic Studies ?DG Chest 2 View ? ?Result Date: 20-May-2022 ?CLINICAL DATA:  States she was working outside yesterday and went inside and states she passed out. States legs were weak. States she has no strength and is nauseated. States she passed out twice, denies CP, hx of COPD. EXAM: CHEST - 2 VIEW COMPARISON:  03/16/2021 FINDINGS: Three sternal wires project over the upper sternum, stable. Cardiac silhouette is mildly enlarged. No mediastinal or hilar masses. Bilateral prominent interstitial markings and lung hyperexpansion, stable. No evidence of pneumonia or pulmonary edema. No convincing pleural effusion and no pneumothorax. Skeletal structures are grossly intact. IMPRESSION: No acute cardiopulmonary disease. Electronically Signed   By: Lajean Manes M.D.   On: 2022/05/20 10:48  ? ?DG Abd 1 View ? ?Result Date: 05/12/2022 ?CLINICAL DATA:  Check gastric catheter placement EXAM: ABDOMEN - 1 VIEW COMPARISON:  None Available. FINDINGS: Gastric catheter is noted within the distal esophagus. This should be advanced several cm deeper into the stomach. IMPRESSION: Gastric catheter in the distal esophagus. This should be advanced several cm deeper into the stomach. Electronically Signed   By: Inez Catalina M.D.   On: 05/12/2022 21:54  ? ?CT HEAD WO CONTRAST (5MM) ? ?Result Date: 2022/05/20 ?CLINICAL DATA:  Head trauma EXAM: CT HEAD WITHOUT CONTRAST TECHNIQUE: Contiguous axial images were obtained from the base of the skull through the vertex without intravenous contrast. RADIATION DOSE REDUCTION: This exam was performed according to the departmental dose-optimization program which includes automated exposure control, adjustment of the mA and/or kV according to patient size and/or use of iterative  reconstruction technique. COMPARISON:  None Available. FINDINGS: Brain: No acute intracranial hemorrhage, mass effect, or herniation. No extra-axial fluid collections. No evidence of acute territorial  infarct. No hydrocephalus. Mild cortical volume loss. Mild patchy hypodensities in the periventricular and subcortical white matter, likely secondary to chronic microvascular ischemic changes. Vascular: Calcified plaques in the carotid siphons. Skull: Normal. Negative for fracture or focal lesion. Sinuses/Orbits: No acute finding. Other: None. IMPRESSION: Chronic changes with no acute intracranial process identified. Electronically Signed   By: Ofilia Neas M.D.   On: 05/03/2022 16:33  ? ?CT Angio Chest Pulmonary Embolism (PE) W or WO Contrast ? ?Result Date: 05/05/2022 ?CLINICAL DATA:  Suspected pulmonary embolism.  Syncope EXAM: CT ANGIOGRAPHY CHEST WITH CONTRAST TECHNIQUE: Multidetector CT imaging of the chest was performed using the standard protocol during bolus administration of intravenous contrast. Multiplanar CT image reconstructions and MIPs were obtained to evaluate the vascular anatomy. RADIATION DOSE REDUCTION: This exam was performed according to the departmental dose-optimization program which includes automated exposure control, adjustment of the mA and/or kV according to patient size and/or use of iterative reconstruction technique. CONTRAST:  71m OMNIPAQUE IOHEXOL 350 MG/ML SOLN COMPARISON:  Chest x-ray earlier the same day, CT chest earlier the same day FINDINGS: Cardiovascular: No pulmonary embolism identified. Main pulmonary artery is normal caliber. Heart is enlarged. No pericardial effusion identified. Coronary artery calcifications. Reflux of contrast into the hepatic veins. Thoracic aorta is normal caliber. Severe atherosclerotic disease. Mediastinum/Nodes: No bulky axillary, hilar or mediastinal lymphadenopathy identified. Calcified subcarinal lymph nodes. Lungs/Pleura: Mild emphysematous changes of the lungs, upper lobe predominant. Biapical pleural thickening. Mild breathing motion and subsegmental atelectatic changes bilaterally. Trace right pleural effusion. No focal consolidation  identified. No pneumothorax. Upper Abdomen: No acute process identified. Calcified granulomas in the spleen. Musculoskeletal: Degenerative changes in the spine. No suspicious bony lesions identified. Review of the MIP images confirms the above findings. IMPRESSION: 1. No pulmonary embolism identified. 2. Cardiomegaly. Reflux of contrast into the hepatic veins which can be seen with right heart failure. 3. Mild emphysematous changes.  Trace right pleural effusion. 4. Coronary artery disease and atherosclerotic disease. Aortic Atherosclerosis (ICD10-I70.0) and Emphysema (ICD10-J43.9). Electronically Signed   By: DOfilia NeasM.D.   On: 05/27/2022 16:32  ? ?DG Chest Port 1 View ? ?Result Date: 05/12/2022 ?CLINICAL DATA:  Hypotension, intubated EXAM: PORTABLE CHEST 1 VIEW COMPARISON:  05/12/2022 at 5:19 p.m. FINDINGS: Single frontal view of the chest demonstrates stable position of the endotracheal tube, left subclavian catheter, and right internal jugular catheter. Cardiac silhouette is enlarged but stable. Interval development of bibasilar interstitial prominence consistent with developing edema. Small bilateral pleural effusions. No pneumothorax. IMPRESSION: 1. Developing bibasilar interstitial edema and small bilateral pleural effusions. 2. Stable support devices. Electronically Signed   By: MRanda NgoM.D.   On: 05/12/2022 22:34  ? ?DG CHEST PORT 1 VIEW ? ?Result Date: 05/12/2022 ?CLINICAL DATA:  Intubated EXAM: PORTABLE CHEST 1 VIEW COMPARISON:  05/12/2022 at 4:05 p.m. FINDINGS: Single frontal view of the chest demonstrates endotracheal tube overlying tracheal air column, tip approximately 4 cm above carina. Stable right internal jugular and left subclavian central venous catheters. The cardiac silhouette is enlarged but stable. Mild central vascular congestion without airspace disease, effusion, or pneumothorax. External defibrillator pads overlie the left chest. IMPRESSION: 1. No complication after  intubation. 2. Persistent central vascular congestion without overt edema. Electronically Signed   By: MRanda NgoM.D.   On: 05/12/2022 17:26  ? ?DG CHEST PORT 1 VIEW ? ?  Result Date: 05/12/2022 ?CLINICAL

## 2022-05-30 NOTE — Progress Notes (Addendum)
Pt has been alert and following commands during shift. Pressor requirement continues to climb.  ? ?Acute bradycardic episode (HR 30-40s), hypotensive. Dr James Ivanoff, elink camera in. Pressors maxed out. ? ?Woke up Daughter at bedside and recommended she call family in. Family called and on their way.  ? ?Dr James Ivanoff updated daughter on critical condition of pt and aware pt's heart is very weak and she is likely dying. Family on the way.  ? ?DNR.  ?

## 2022-05-30 NOTE — Progress Notes (Signed)
ANTICOAGULATION CONSULT NOTE - Follow Up Consult ? ?Pharmacy Consult for heparin ?Indication:  ACS ? ?Labs: ?Recent Labs  ?  05/06/2022 ?0912 05/15/2022 ?0932 05/12/22 ?0429 05/12/22 ?4196 05/12/22 ?1555 05/12/22 ?1635 05/12/22 ?1644 05/12/22 ?1728 05/12/22 ?2054 05/12/22 ?2214 05/23/22 ?0355  ?HGB 12.4   < > 11.3*  --   --  9.3*   < > 9.5* 9.5* 9.9*  --   ?HCT 36.7   < > 34.2*  --   --  27.7*   < > 28.0* 28.0* 29.0*  --   ?PLT 199  --  118*  --   --  88*  --   --   --   --   --   ?APTT  --   --   --   --   --  92*  --   --   --   --   --   ?LABPROT  --   --   --  26.6*  --   --   --   --   --   --   --   ?INR  --   --   --  2.5*  --   --   --   --   --   --   --   ?HEPARINUNFRC  --    < > 0.21*  --  0.12*  --   --   --   --   --  0.40  ?CREATININE 1.87*   < > 2.93*  --  3.70* 3.71*  --   --   --   --  4.83*  ?CKTOTAL  --   --  1,346*  --   --   --   --   --   --   --   --   ?TROPONINIHS >24,000*  --   --   --   --   --   --   --   --   --   --   ? < > = values in this interval not displayed.  ? ? ?Assessment/Plan:  ?71yo female therapeutic on heparin after rate change. Will continue infusion at current rate of 1000 units/hr and monitor daily level.  ? ?Wynona Neat, PharmD, BCPS  ?05-23-22,6:24 AM ? ? ?

## 2022-05-30 NOTE — Plan of Care (Signed)
Family at the bedside and request transition to comfort care, orders entered. ? ? ?Otilio Carpen Lavona Norsworthy, PA-C ? ?

## 2022-05-30 NOTE — Progress Notes (Signed)
Patient was compassionately extubated to room air at 07:53 with family and RN in the room.  ?

## 2022-05-30 NOTE — Progress Notes (Signed)
Patient ID: VARNIKA BUTZ, female   DOB: [birth year], 71 y.o.   MRN: [id number] ?  ? ? Advanced Heart Failure Rounding Note ? ?PCP-Cardiologist: Carlyle Dolly, MD  ? ?Subjective:   ? ?Patient had PEA arrest yesterday when initiating CVVH and was intubated with round of ACLS.  She has required escalating pressors overnight and has become increasingly acidotic.   ? ?Family has decided on comfort care, they are now at bedside with plan to withdraw later this morning.  ? ? ?Objective:   ?Weight Range: ?70 kg ?Body mass index is 26.49 kg/m?.  ? ?Vital Signs:   ?Temp:  [97.7 ?F (36.5 ?C)-98 ?F (36.7 ?C)] 97.7 ?F (36.5 ?C) (05/15 0400) ?Pulse Rate:  [52-120] 73 (05/15 0723) ?Resp:  [16-54] 23 (05/15 0723) ?BP: (67-154)/(28-110) 96/43 (05/15 0320) ?SpO2:  [74 %-100 %] 95 % (05/15 0723) ?Arterial Line BP: (1-347)/(-10-318) 108/52 (05/15 0723) ?FiO2 (%):  [32 %-100 %] 60 % (05/15 0723) ?Last BM Date : 05/10/22 ? ?Weight change: ?Filed Weights  ? 05/06/2022 0905 05/12/22 0500  ?Weight: 63.5 kg 70 kg  ? ? ?Intake/Output:  ? ?Intake/Output Summary (Last 24 hours) at 05-15-2022 0732 ?Last data filed at May 15, 2022 3154 ?Gross per 24 hour  ?Intake 1886.24 ml  ?Output 300 ml  ?Net 1586.24 ml  ?  ? ? ?Physical Exam  ?  ?General:  Intubated/sedated.  ?HEENT: Normal ?Neck: Supple. JVP 16 cm. Carotids 2+ bilat; no bruits. No lymphadenopathy or thyromegaly appreciated. ?Cor: PMI nondisplaced. Regular rate & rhythm. No rubs, gallops or murmurs. ?Lungs: Clear ?Abdomen: Soft, nontender, nondistended. No hepatosplenomegaly. No bruits or masses. Good bowel sounds. ?Extremities: No cyanosis, clubbing, rash, edema ?Neuro: Sedated ? ? ?Telemetry  ? ?Off telemetry ? ? ?Labs  ?  ?CBC ?Recent Labs  ?  05/12/22 ?1635 05/12/22 ?1644 15-May-2022 ?0439 05/15/2022 ?0527  ?WBC 9.5  --   --  9.2  ?NEUTROABS 6.9  --   --   --   ?HGB 9.3*   < > 8.8* 8.0*  ?HCT 27.7*   < > 26.0* 25.1*  ?MCV 98.6  --   --  105.5*  ?PLT 88*  --   --  67*  ? < > = values in this interval  not displayed.  ? ?Basic Metabolic Panel ?Recent Labs  ?  05/12/22 ?1555 05/12/22 ?1635 05/12/22 ?1644 05/15/22 ?0355 05/15/22 ?0439  ?NA 126* 130*   < > 130* 130*  ?K 5.1 4.7   < > 4.9 4.7  ?CL 98 99  --  96*  --   ?CO2 12* 13*  --  8*  --   ?GLUCOSE 134* 130*  --  88  --   ?BUN 34* 36*  --  41*  --   ?CREATININE 3.70* 3.71*  --  4.83*  --   ?CALCIUM 8.2* 7.3*  --  8.1*  --   ?MG  --  2.0  --  2.3  --   ?PHOS 7.0*  --   --  11.7*  --   ? < > = values in this interval not displayed.  ? ?Liver Function Tests ?Recent Labs  ?  05/12/22 ?0429 05/12/22 ?1555 05-15-22 ?0355  ?AST 899*  --  5,539*  ?ALT 760*  --  5,408*  ?ALKPHOS 77  --  82  ?BILITOT 1.1  --  2.2*  ?PROT 5.2*  --  5.1*  ?ALBUMIN 3.2* 3.3* 3.0*  ? ?Recent Labs  ?  05/14/2022 ?1108  ?LIPASE 31  ? ?  Cardiac Enzymes ?Recent Labs  ?  05/12/22 ?0429  ?CKTOTAL 1,346*  ? ? ?BNP: ?BNP (last 3 results) ?Recent Labs  ?  05/06/2022 ?0912  ?BNP 237.0*  ? ? ?ProBNP (last 3 results) ?No results for input(s): PROBNP in the last 8760 hours. ? ? ?D-Dimer ?No results for input(s): DDIMER in the last 72 hours. ?Hemoglobin A1C ?No results for input(s): HGBA1C in the last 72 hours. ?Fasting Lipid Panel ?No results for input(s): CHOL, HDL, LDLCALC, TRIG, CHOLHDL, LDLDIRECT in the last 72 hours. ?Thyroid Function Tests ?Recent Labs  ?  05/12/2022 ?0934  ?TSH 3.112  ? ? ?Other results: ? ? ?Imaging  ? ? ?DG Abd 1 View ? ?Result Date: 05/12/2022 ?CLINICAL DATA:  Check gastric catheter placement EXAM: ABDOMEN - 1 VIEW COMPARISON:  None Available. FINDINGS: Gastric catheter is noted within the distal esophagus. This should be advanced several cm deeper into the stomach. IMPRESSION: Gastric catheter in the distal esophagus. This should be advanced several cm deeper into the stomach. Electronically Signed   By: Inez Catalina M.D.   On: 05/12/2022 21:54  ? ?DG Chest Port 1 View ? ?Result Date: 05/12/2022 ?CLINICAL DATA:  Hypotension, intubated EXAM: PORTABLE CHEST 1 VIEW COMPARISON:  05/12/2022  at 5:19 p.m. FINDINGS: Single frontal view of the chest demonstrates stable position of the endotracheal tube, left subclavian catheter, and right internal jugular catheter. Cardiac silhouette is enlarged but stable. Interval development of bibasilar interstitial prominence consistent with developing edema. Small bilateral pleural effusions. No pneumothorax. IMPRESSION: 1. Developing bibasilar interstitial edema and small bilateral pleural effusions. 2. Stable support devices. Electronically Signed   By: Randa Ngo M.D.   On: 05/12/2022 22:34  ? ?DG CHEST PORT 1 VIEW ? ?Result Date: 05/12/2022 ?CLINICAL DATA:  Intubated EXAM: PORTABLE CHEST 1 VIEW COMPARISON:  05/12/2022 at 4:05 p.m. FINDINGS: Single frontal view of the chest demonstrates endotracheal tube overlying tracheal air column, tip approximately 4 cm above carina. Stable right internal jugular and left subclavian central venous catheters. The cardiac silhouette is enlarged but stable. Mild central vascular congestion without airspace disease, effusion, or pneumothorax. External defibrillator pads overlie the left chest. IMPRESSION: 1. No complication after intubation. 2. Persistent central vascular congestion without overt edema. Electronically Signed   By: Randa Ngo M.D.   On: 05/12/2022 17:26  ? ?DG CHEST PORT 1 VIEW ? ?Result Date: 05/12/2022 ?CLINICAL DATA:  Line placement EXAM: PORTABLE CHEST 1 VIEW COMPARISON:  Chest x-ray 05/20/2022 FINDINGS: Interval placement of a right internal jugular line with the tip in the SVC. Left subclavian line is stable. Cardiomediastinal silhouette is unchanged. Calcified subcarinal lymph node again noted. Stable mild interstitial prominence with no new consolidation identified. Bibasilar likely subsegmental atelectasis. No significant pleural effusion. No pneumothorax. IMPRESSION: 1. Right internal jugular line placement with the tip in the SVC. No pneumothorax. 2. Otherwise no significant change since previous  study. Electronically Signed   By: Ofilia Neas M.D.   On: 05/12/2022 16:19   ? ? ?Medications:   ? ? ?Scheduled Medications: ? atorvastatin  40 mg Oral Daily  ? chlorhexidine gluconate (MEDLINE KIT)  15 mL Mouth Rinse BID  ? Chlorhexidine Gluconate Cloth  6 each Topical Q0600  ? docusate  100 mg Per Tube BID  ? fentaNYL (SUBLIMAZE) injection  25 mcg Intravenous Once  ? levothyroxine  88 mcg Per Tube Q0600  ? mouth rinse  15 mL Mouth Rinse 10 times per day  ? melatonin  3 mg Oral QHS  ?  OXcarbazepine  150 mg Oral BID  ? polyethylene glycol  17 g Per Tube Daily  ? revefenacin  175 mcg Nebulization Daily  ? ? ?Infusions: ?  prismasol BGK 4/2.5    ?  prismasol BGK 4/2.5    ? sodium chloride Stopped (05/12/22 1020)  ? sodium chloride    ? ceFEPime (MAXIPIME) IV    ? dextrose    ? DOBUTamine 3 mcg/kg/min (2022-05-17 0723)  ? epinephrine 20 mcg/min (05/17/2022 0700)  ? fentaNYL infusion INTRAVENOUS 100 mcg/hr (17-May-2022 0723)  ? heparin 1,000 Units/hr (05-17-2022 0723)  ? midazolam 2 mg/hr (2022/05/17 0724)  ? midazolam-sodium chloride    ? norepinephrine (LEVOPHED) Adult infusion 60 mcg/min (05-17-2022 0723)  ? prismasol BGK 4/2.5    ?  sodium bicarbonate (isotonic) infusion in sterile water 50 mL/hr at 2022-05-17 0723  ? vasopressin 0.03 Units/min (05-17-22 0723)  ? ? ?PRN Medications: ?Place/Maintain arterial line **AND** sodium chloride, acetaminophen **OR** acetaminophen, diphenhydrAMINE, docusate sodium, fentaNYL, glycopyrrolate **OR** glycopyrrolate **OR** glycopyrrolate, heparin, metoCLOPramide (REGLAN) injection, midazolam, midazolam, ondansetron (ZOFRAN) IV, polyethylene glycol, polyvinyl alcohol ? ? ? ?Assessment/Plan  ? ?1. Cardiogenic shock/cor pulmonale: Echo this admission with EF 65-70% with no WMAs, moderate LVH, severe RV dilation with moderate RV dysfunction and McConnell's sign, severe TR (not sure PASP accurate) with dilated IVC.  Initial concern for PE, but CTA chest did not show PE.  No LV wall motion  abnormalities on echo to suggest large MI.  Inferior wall moves well and no inferior STE so doubt inferior MI with RV infarct . The RV is severely dilated, suggestive to me of more chronic abnormality.  She

## 2022-05-30 NOTE — Progress Notes (Signed)
[place] received page from RN sharing that family were gathered at pt.'s bedside in anticipation of pt. being transitioned to comfort care this AM.  When [facility] arrived, pt.'s two dtrs., son, and brother were at bedside; pt. lying in bed intubated; family shared that pt. was alert and talking yesterday and that she had been taken to begin dialysis when she "flatlined."  Family requested [place] pray for pt., remarking that pt. has been responding to their voices and questions despite being intubated. [facility] offered prayer for God's comfort and peace for pt. and strength for family.  RN will order family comfort cart; [place] understands pt. will have breathing tube removed later this AM.  Referral made to daytime [place] team for potential follow-up. ? ?Lindaann Pascal, Chaplain ?Pager: [phone] ? ?

## 2022-05-30 DEATH — deceased
